# Patient Record
Sex: Male | Born: 1950 | Hispanic: Yes | Smoking: Never smoker
Health system: Southern US, Community
[De-identification: ages and names within clinical notes are randomized; demographics above are authoritative.]

## PROBLEM LIST (undated history)

## (undated) DIAGNOSIS — E785 Hyperlipidemia, unspecified: Secondary | ICD-10-CM

## (undated) HISTORY — PX: TONSILLECTOMY: SUR1361

## (undated) HISTORY — DX: Hyperlipidemia, unspecified: E78.5

---

## 1989-12-08 HISTORY — PX: INGUINAL HERNIA REPAIR: SUR1180

## 2016-10-07 DIAGNOSIS — R1084 Generalized abdominal pain: Secondary | ICD-10-CM | POA: Diagnosis not present

## 2016-10-07 DIAGNOSIS — Z6823 Body mass index (BMI) 23.0-23.9, adult: Secondary | ICD-10-CM | POA: Diagnosis not present

## 2016-10-14 DIAGNOSIS — Z1159 Encounter for screening for other viral diseases: Secondary | ICD-10-CM | POA: Diagnosis not present

## 2016-10-14 DIAGNOSIS — I10 Essential (primary) hypertension: Secondary | ICD-10-CM | POA: Diagnosis not present

## 2016-10-17 DIAGNOSIS — Z6823 Body mass index (BMI) 23.0-23.9, adult: Secondary | ICD-10-CM | POA: Diagnosis not present

## 2016-10-17 DIAGNOSIS — E785 Hyperlipidemia, unspecified: Secondary | ICD-10-CM | POA: Diagnosis not present

## 2016-10-17 DIAGNOSIS — Z0000 Encounter for general adult medical examination without abnormal findings: Secondary | ICD-10-CM | POA: Diagnosis not present

## 2016-10-17 DIAGNOSIS — R1084 Generalized abdominal pain: Secondary | ICD-10-CM | POA: Diagnosis not present

## 2016-10-27 ENCOUNTER — Encounter: Payer: Self-pay | Admitting: Gastroenterology

## 2016-10-27 DIAGNOSIS — J060 Acute laryngopharyngitis: Secondary | ICD-10-CM | POA: Diagnosis not present

## 2016-10-27 DIAGNOSIS — R05 Cough: Secondary | ICD-10-CM | POA: Diagnosis not present

## 2016-11-12 ENCOUNTER — Other Ambulatory Visit: Payer: Self-pay

## 2016-11-12 ENCOUNTER — Encounter: Payer: Self-pay | Admitting: Nurse Practitioner

## 2016-11-12 ENCOUNTER — Ambulatory Visit (INDEPENDENT_AMBULATORY_CARE_PROVIDER_SITE_OTHER): Payer: Medicare Other | Admitting: Nurse Practitioner

## 2016-11-12 DIAGNOSIS — R109 Unspecified abdominal pain: Secondary | ICD-10-CM | POA: Insufficient documentation

## 2016-11-12 DIAGNOSIS — K4090 Unilateral inguinal hernia, without obstruction or gangrene, not specified as recurrent: Secondary | ICD-10-CM

## 2016-11-12 DIAGNOSIS — R1030 Lower abdominal pain, unspecified: Secondary | ICD-10-CM

## 2016-11-12 DIAGNOSIS — Z1211 Encounter for screening for malignant neoplasm of colon: Secondary | ICD-10-CM

## 2016-11-12 MED ORDER — PEG 3350-KCL-NA BICARB-NACL 420 G PO SOLR: 4000.0000 mL | ORAL | 0 refills | Status: DC

## 2016-11-12 NOTE — Progress Notes (Signed)
Primary Care Physician:  Dwana MelenaZack Hall, MD Primary Gastroenterologist:  Dr. Darrick PennaFields  Chief Complaint  Patient presents with  . Abdominal Pain    mid abd x 1 year  . Constipation    diarrhea when takes Linzess    HPI:   Omar Bell is a 65 y.o. male who presents on referral from primary care for abdominal pain. Patient last saw primary care on 10/17/2016 for annual wellness visit and follow-up on labs. At that time it was noted he is having abdominal pain which is improved with Linzess however hesitant to take due to diarrhea side effects, continues abdominal pain at times but not as bad as previous. This is been going on for 1 year. Exam "looks great", query IBS and referred to GI for further evaluation.  Today he states his abdominal pain started about a year ago. Possible increased stress due to work. Linzess did help some. He isn't taking it regularly because "Im not big on medications and I dont want something to treat symptoms, I would like to know the cause and treat that." Discussed etiology of constipation and mechanism of Linzess. Tries to drink 4-5 glasses of water a day. Pain occasionally brought on by water. His pain is lower abdomen, intermittent, described as "like a knife in yoru stomach." When he has a significant symptoms, cant stand out straight and needs to lie flat. History of bilateral inguinal hernia. When he has a protruding hernia he has "rock hard area at the protrusion" with associated pain and constipation. Occasionally goes 1-2 months without noticeable pain, but will sometimes have several episodes in a week. Noticed for a time that symptoms were occurring on Sundays. Has had one episode of hematochezia in the last year, has had hemorrhoids previously. Denies melena, unintentional weight loss, fever, chills. Does have dark stools but formed (consistent with [place] 3-4). Denies chest pain, dyspnea, dizziness, lightheadedness, syncope, near syncope. Denies any other  upper or lower GI symptoms.  Has never had a colonoscopy before.  Past Medical History:  Diagnosis Date  . Hyperlipidemia     Past Surgical History:  Procedure Laterality Date  . INGUINAL HERNIA REPAIR      No current outpatient prescriptions on file.   No current facility-administered medications for this visit.     Allergies as of 11/12/2016  . (No Known Allergies)    Family History  Problem Relation Age of Onset  . Colon cancer Neg Hx     Social History   Social History  . Marital status: Single    Spouse name: N/A  . Number of children: N/A  . Years of education: N/A   Occupational History  . Not on file.   Social History Main Topics  . Smoking status: Never Smoker  . Smokeless tobacco: Never Used  . Alcohol use No  . Drug use: No  . Sexual activity: Not on file   Other Topics Concern  . Not on file   Social History Narrative  . No narrative on file    Review of Systems: Complete ROS negative except as per HPI.    Physical Exam: BP 123/81   Pulse 73   Temp 97.9 F (36.6 C) (Oral)   Ht 5\' 10"  (1.778 m)   Wt 158 lb 6.4 oz (71.8 kg)   BMI 22.73 kg/m  General:   Alert and oriented. Pleasant and cooperative. Well-nourished and well-developed.  Head:  Normocephalic and atraumatic. Eyes:  Without icterus, sclera clear and conjunctiva  pink.  Ears:  Normal auditory acuity. Cardiovascular:  S1, S2 present without murmurs appreciated. Extremities without clubbing or edema. Respiratory:  Clear to auscultation bilaterally. No wheezes, rales, or rhonchi. No distress.  Gastrointestinal:  +BS, soft, non-tender and non-distended. No HSM noted. No guarding or rebound. No masses appreciated.  Rectal:  Deferred  Musculoskalatal:  Symmetrical without gross deformities.  Neurologic:  Alert and oriented x4;  grossly normal neurologically. Psych:  Alert and cooperative. Normal mood and affect. Heme/Lymph/Immune: No excessive bruising noted.    11/12/2016  3:44 PM   Disclaimer: This note was dictated with voice recognition software. Similar sounding words can inadvertently be transcribed and may not be corrected upon review.

## 2016-11-12 NOTE — Patient Instructions (Addendum)
1. Have your x-ray done when you're able to. 2. We will refer you to a surgeon to evaluate your hernias. 3. We will schedule your colonoscopy for you. 4. Further recommendations to be made based on the results of your colonoscopy. 5. Return for follow-up in 3 months. 6. To help with possible constipation increase the fiber and water in her diet. Further information below. You can also look into adding a fiber supplement 1-2 times a day which are available over-the-counter and includes things such as Metamucil, Benefiber, fiber chews, fiber dummies, etc.    High-Fiber Diet Fiber, also called dietary fiber, is a type of carbohydrate found in fruits, vegetables, whole grains, and beans. A high-fiber diet can have many health benefits. Your health care provider may recommend a high-fiber diet to help:  Prevent constipation. Fiber can make your bowel movements more regular.  Lower your cholesterol.  Relieve hemorrhoids, uncomplicated diverticulosis, or irritable bowel syndrome.  Prevent overeating as part of a weight-loss plan.  Prevent heart disease, type 2 diabetes, and certain cancers. What is my plan? The recommended daily intake of fiber includes:  38 grams for men under age 66.  30 grams for men over age 66.  25 grams for women under age 66.  21 grams for women over age 66. You can get the recommended daily intake of dietary fiber by eating a variety of fruits, vegetables, grains, and beans. Your health care provider may also recommend a fiber supplement if it is not possible to get enough fiber through your diet. What do I need to know about a high-fiber diet?  Fiber supplements have not been widely studied for their effectiveness, so it is better to get fiber through food sources.  Always check the fiber content on thenutrition facts label of any prepackaged food. Look for foods that contain at least 5 grams of fiber per serving.  Ask your dietitian if you have questions  about specific foods that are related to your condition, especially if those foods are not listed in the following section.  Increase your daily fiber consumption gradually. Increasing your intake of dietary fiber too quickly may cause bloating, cramping, or gas.  Drink plenty of water. Water helps you to digest fiber. What foods can I eat? Grains  Whole-grain breads. Multigrain cereal. Oats and oatmeal. Brown rice. Barley. Bulgur wheat. Millet. Bran muffins. Popcorn. Rye wafer crackers. Vegetables  Sweet potatoes. Spinach. Kale. Artichokes. Cabbage. Broccoli. Green peas. Carrots. Squash. Fruits  Berries. Pears. Apples. Oranges. Avocados. Prunes and raisins. Dried figs. Meats and Other Protein Sources  Navy, kidney, pinto, and soy beans. Split peas. Lentils. Nuts and seeds. Dairy  Fiber-fortified yogurt. Beverages  Fiber-fortified soy milk. Fiber-fortified orange juice. Other  Fiber bars. The items listed above may not be a complete list of recommended foods or beverages. Contact your dietitian for more options.  What foods are not recommended? Grains  White bread. Pasta made with refined flour. White rice. Vegetables  Fried potatoes. Canned vegetables. Well-cooked vegetables. Fruits  Fruit juice. Cooked, strained fruit. Meats and Other Protein Sources  Fatty cuts of meat. Fried Environmental education officerpoultry or fried fish. Dairy  Milk. Yogurt. Cream cheese. Sour cream. Beverages  Soft drinks. Other  Cakes and pastries. Butter and oils. The items listed above may not be a complete list of foods and beverages to avoid. Contact your dietitian for more information.  What are some tips for including high-fiber foods in my diet?  Eat a wide variety of high-fiber foods.  Make sure that half of all grains consumed each day are whole grains.  Replace breads and cereals made from refined flour or white flour with whole-grain breads and cereals.  Replace white rice with brown rice, bulgur wheat, or  millet.  Start the day with a breakfast that is high in fiber, such as a cereal that contains at least 5 grams of fiber per serving.  Use beans in place of meat in soups, salads, or pasta.  Eat high-fiber snacks, such as berries, raw vegetables, nuts, or popcorn. This information is not intended to replace advice given to you by your health care provider. Make sure you discuss any questions you have with your health care provider. Document Released: 11/24/2005 Document Revised: 05/01/2016 Document Reviewed: 05/09/2014 Elsevier Interactive Patient Education  2017 [employer]     Constipation, Adult Constipation is when a person has fewer bowel movements in a week than normal, has difficulty having a bowel movement, or has stools that are dry, hard, or larger than normal. Constipation may be caused by an underlying condition. It may become worse with age if a person takes certain medicines and does not take in enough fluids. Follow these instructions at home: Eating and drinking  Eat foods that have a lot of fiber, such as fresh fruits and vegetables, whole grains, and beans.  Limit foods that are high in fat, low in fiber, or overly processed, such as french fries, hamburgers, cookies, candies, and soda.  Drink enough fluid to keep your urine clear or pale yellow. General instructions  Exercise regularly or as told by your health care provider.  Go to the restroom when you have the urge to go. Do not hold it in.  Take over-the-counter and prescription medicines only as told by your health care provider. These include any fiber supplements.  Practice pelvic floor retraining exercises, such as deep breathing while relaxing the lower abdomen and pelvic floor relaxation during bowel movements.  Watch your condition for any changes.  Keep all follow-up visits as told by your health care provider. This is important. Contact a health care provider if:  You have pain that gets  worse.  You have a fever.  You do not have a bowel movement after 4 days.  You vomit.  You are not hungry.  You lose weight.  You are bleeding from the anus.  You have thin, pencil-like stools. Get help right away if:  You have a fever and your symptoms suddenly get worse.  You leak stool or have blood in your stool.  Your abdomen is bloated.  You have severe pain in your abdomen.  You feel dizzy or you faint. This information is not intended to replace advice given to you by your health care provider. Make sure you discuss any questions you have with your health care provider. Document Released: 08/22/2004 Document Revised: 06/13/2016 Document Reviewed: 05/14/2016 Elsevier Interactive Patient Education  2017 [employer]

## 2016-11-12 NOTE — Assessment & Plan Note (Signed)
The patient notes a one-year history of abdominal pain. This improved on Linzess but he is not taking it regularly. At this point I will check an abdominal x-ray for stool burden as I feel his abdominal pain is likely due to constipation. However, he has bilateral inguinal hernias and may occasionally "pop out" and when they do typically are "rock hard" and occasionally symptomatic with pain in onset of constipation when this happens. He has had one of his hernias operated on previously when he lived in another area. This point I recommend refer to surgeon to evaluate bilateral inguinal hernias for options including but not limited to surgical repair. I will recommend he increase the fiber and water in his diet. He can take a fiber supplement as well.   We will also proceed with colonoscopy because he is never had a colonoscopy before. Return for follow-up in 3 months.  Proceed with colonoscopy WITHOUT SEDATION with Dr. Darrick PennaFields in the near future. The risks, benefits, and alternatives have been discussed in detail with the patient. They state understanding and desire to proceed.   The patient is not currently on any medications. He is specifically requesting the procedure to be done without sedation and I have discussed that there is always the option to change his mind the day of the procedure or during the procedure if he would like sedation medicine. He verbalizes understanding.

## 2016-11-13 ENCOUNTER — Ambulatory Visit (HOSPITAL_COMMUNITY)
Admission: RE | Admit: 2016-11-13 | Discharge: 2016-11-13 | Disposition: A | Discharge status: Home / Self Care | Payer: Medicare Other | Source: Ambulatory Visit | Attending: Nurse Practitioner | Admitting: Nurse Practitioner

## 2016-11-13 DIAGNOSIS — R1030 Lower abdominal pain, unspecified: Secondary | ICD-10-CM | POA: Diagnosis not present

## 2016-11-13 DIAGNOSIS — K5900 Constipation, unspecified: Secondary | ICD-10-CM | POA: Diagnosis not present

## 2016-11-13 NOTE — Addendum Note (Signed)
Addended by: Delane GingerGILL, ERIC A on: 11/13/2016 04:05 PM   Modules accepted: Orders

## 2016-11-14 ENCOUNTER — Telehealth: Payer: Self-pay

## 2016-11-14 NOTE — Telephone Encounter (Signed)
Called and informed pt of appt with Dr. Earlene Plateravis 11/26/16 at 2:00pm.

## 2016-11-17 ENCOUNTER — Telehealth: Payer: Self-pay | Admitting: Internal Medicine

## 2016-11-17 NOTE — Progress Notes (Signed)
Tried to call and Vm not set up.

## 2016-11-17 NOTE — Progress Notes (Signed)
CC'D TO PCP °

## 2016-11-18 NOTE — Progress Notes (Signed)
Pt is aware of results and plan.

## 2016-11-20 ENCOUNTER — Telehealth: Payer: Self-pay

## 2016-11-20 NOTE — Telephone Encounter (Signed)
Called pt. He can be at hospital at 11:45am 11/25/16 for TCS at 12:45pm. New times for prep given. Called and informed scheduler.

## 2016-11-20 NOTE — Telephone Encounter (Signed)
Opened in error

## 2016-11-20 NOTE — Telephone Encounter (Signed)
Attempted to call pt at 10:45am to inform of TCS scheduled for 11/25/16 has been changed to 12:45pm. No answer, voicemail has not been setup.

## 2016-11-21 ENCOUNTER — Telehealth: Payer: Self-pay | Admitting: Nurse Practitioner

## 2016-11-21 NOTE — Telephone Encounter (Signed)
Attempted to call pt x 2, no answer and voicemail hasn't been set-up. Time for TCS 11/25/16 has changed d/t a case had to be added in OR before his case. Need to inform pt of new arrival time and new times for instructions on day of procedure.

## 2016-11-21 NOTE — Telephone Encounter (Signed)
Patient called that he would like to cancel his TCS for Tuesday. FYI

## 2016-11-24 NOTE — Telephone Encounter (Signed)
Colonoscopy for 11/25/16 cancelled per pt request. LMOVM and informed Endo scheduler.

## 2016-11-25 ENCOUNTER — Ambulatory Visit (HOSPITAL_COMMUNITY): Admission: RE | Admit: 2016-11-25 | Payer: Medicare Other | Source: Ambulatory Visit | Admitting: Gastroenterology

## 2016-11-25 ENCOUNTER — Encounter (HOSPITAL_COMMUNITY): Admission: RE | Payer: Self-pay | Source: Ambulatory Visit

## 2016-11-25 SURGERY — COLONOSCOPY
Anesthesia: Moderate Sedation

## 2016-11-26 DIAGNOSIS — K4021 Bilateral inguinal hernia, without obstruction or gangrene, recurrent: Secondary | ICD-10-CM | POA: Diagnosis not present

## 2016-11-29 NOTE — Progress Notes (Signed)
REVIEWED. PT CANCELED TCS.

## 2017-02-10 ENCOUNTER — Encounter: Payer: Self-pay | Admitting: Gastroenterology

## 2017-02-10 ENCOUNTER — Encounter: Payer: Self-pay | Admitting: Nurse Practitioner

## 2017-02-10 ENCOUNTER — Ambulatory Visit: Payer: Medicare Other | Admitting: Nurse Practitioner

## 2017-02-10 ENCOUNTER — Telehealth: Payer: Self-pay | Admitting: Nurse Practitioner

## 2017-02-10 NOTE — Progress Notes (Deleted)
    Referring Provider: [name], [name], MD Primary Care Physician:  Dwana MelenaZack Hall, MD Primary GI:  Dr. Darrick PennaFields  No chief complaint on file.   HPI:   Omar Bell is a 66 y.o. male who presents for follow-up on lower abdominal pain. The patient was last seen in our office 11/12/2016 for the same. At that time he noted his abdominal pain started about a year prior with increased work stress. Linzess did help some. He isn't taking it regularly because "Im not big on medications and I dont want something to treat symptoms, I would like to know the cause and treat that." History of inguinal hernia bilateral with occasional pain, occasionally goes 1-2 months without symptoms. Noted 1 episode of hematochezia and previous year in the setting of hemorrhoids. Had never had a colonoscopy before. Abdominal x-ray was ordered, referred to surgeon to evaluate inguinal hernias, recommended colonoscopy, return for follow-up in 3 months. Recommended increased fiber and water and written education materials provided related to high fiber diet.  Abdominal x-ray found normal bowel gas pattern, mild amount of stool in the right colon, no evidence of bowel obstruction or ileus. He was notified of his referral to surgery date and time. He: 11/21/2016 to schedule his colonoscopy which had been planned for 11/25/2016.  Today he states   Past Medical History:  Diagnosis Date  . Hyperlipidemia     Past Surgical History:  Procedure Laterality Date  . INGUINAL HERNIA REPAIR      No current outpatient prescriptions on file.   No current facility-administered medications for this visit.     Allergies as of 02/10/2017  . (No Known Allergies)    Family History  Problem Relation Age of Onset  . Colon cancer Neg Hx     Social History   Social History  . Marital status: Divorced    Spouse name: N/A  . Number of children: N/A  . Years of education: N/A   Social History Main Topics  . Smoking status: Never  Smoker  . Smokeless tobacco: Never Used  . Alcohol use No  . Drug use: No  . Sexual activity: Not on file   Other Topics Concern  . Not on file   Social History Narrative  . No narrative on file    Review of Systems: Complete ROS negative except as per HPI.   Physical Exam: There were no vitals taken for this visit. General:   Alert and oriented. Pleasant and cooperative. Well-nourished and well-developed.  Head:  Normocephalic and atraumatic. Eyes:  Without icterus, sclera clear and conjunctiva pink.  Ears:  Normal auditory acuity. Mouth:  No deformity or lesions, oral mucosa pink.  Throat/Neck:  Supple, without mass or thyromegaly. Cardiovascular:  S1, S2 present without murmurs appreciated. Normal pulses noted. Extremities without clubbing or edema. Respiratory:  Clear to auscultation bilaterally. No wheezes, rales, or rhonchi. No distress.  Gastrointestinal:  +BS, soft, non-tender and non-distended. No HSM noted. No guarding or rebound. No masses appreciated.  Rectal:  Deferred  Musculoskalatal:  Symmetrical without gross deformities. Normal posture. Skin:  Intact without significant lesions or rashes. Neurologic:  Alert and oriented x4;  grossly normal neurologically. Psych:  Alert and cooperative. Normal mood and affect. Heme/Lymph/Immune: No significant cervical adenopathy. No excessive bruising noted.    02/10/2017 9:48 AM   Disclaimer: This note was dictated with voice recognition software. Similar sounding words can inadvertently be transcribed and may not be corrected upon review.

## 2017-02-10 NOTE — Telephone Encounter (Signed)
Patient was a no show and letter sent  °

## 2017-02-10 NOTE — Telephone Encounter (Signed)
Noted  

## 2017-02-24 ENCOUNTER — Telehealth: Payer: Self-pay | Admitting: Gastroenterology

## 2017-02-24 NOTE — Telephone Encounter (Signed)
Pt sent us a letter explaining why he missed his appointment on 02/10/2017.  His phone wasn't on and he had been sick in the bed.

## 2017-05-13 DIAGNOSIS — E785 Hyperlipidemia, unspecified: Secondary | ICD-10-CM | POA: Diagnosis not present

## 2017-05-15 DIAGNOSIS — K5900 Constipation, unspecified: Secondary | ICD-10-CM | POA: Diagnosis not present

## 2017-05-15 DIAGNOSIS — E785 Hyperlipidemia, unspecified: Secondary | ICD-10-CM | POA: Diagnosis not present

## 2019-03-18 ENCOUNTER — Other Ambulatory Visit: Payer: Self-pay

## 2019-03-18 ENCOUNTER — Emergency Department (HOSPITAL_COMMUNITY): Payer: 59 | Admitting: Anesthesiology

## 2019-03-18 ENCOUNTER — Encounter (HOSPITAL_COMMUNITY): Admission: EM | Disposition: A | Discharge status: Home Health | Payer: Self-pay | Source: Home / Self Care

## 2019-03-18 ENCOUNTER — Encounter (HOSPITAL_COMMUNITY): Payer: Self-pay

## 2019-03-18 ENCOUNTER — Inpatient Hospital Stay (HOSPITAL_COMMUNITY)
Admission: EM | Admit: 2019-03-18 | Discharge: 2019-04-04 | DRG: 329 | Disposition: A | Discharge status: Home Health | Payer: 59 | Attending: Surgery | Admitting: Surgery

## 2019-03-18 ENCOUNTER — Emergency Department (HOSPITAL_COMMUNITY): Payer: 59

## 2019-03-18 DIAGNOSIS — K4090 Unilateral inguinal hernia, without obstruction or gangrene, not specified as recurrent: Secondary | ICD-10-CM

## 2019-03-18 DIAGNOSIS — Z9119 Patient's noncompliance with other medical treatment and regimen: Secondary | ICD-10-CM | POA: Diagnosis not present

## 2019-03-18 DIAGNOSIS — Z809 Family history of malignant neoplasm, unspecified: Secondary | ICD-10-CM

## 2019-03-18 DIAGNOSIS — Z6821 Body mass index (BMI) 21.0-21.9, adult: Secondary | ICD-10-CM

## 2019-03-18 DIAGNOSIS — K4190 Unilateral femoral hernia, without obstruction or gangrene, not specified as recurrent: Secondary | ICD-10-CM | POA: Diagnosis present

## 2019-03-18 DIAGNOSIS — K9189 Other postprocedural complications and disorders of digestive system: Secondary | ICD-10-CM

## 2019-03-18 DIAGNOSIS — K631 Perforation of intestine (nontraumatic): Secondary | ICD-10-CM | POA: Diagnosis present

## 2019-03-18 DIAGNOSIS — E860 Dehydration: Secondary | ICD-10-CM

## 2019-03-18 DIAGNOSIS — I5031 Acute diastolic (congestive) heart failure: Secondary | ICD-10-CM | POA: Diagnosis not present

## 2019-03-18 DIAGNOSIS — E440 Moderate protein-calorie malnutrition: Secondary | ICD-10-CM | POA: Diagnosis present

## 2019-03-18 DIAGNOSIS — J45909 Unspecified asthma, uncomplicated: Secondary | ICD-10-CM | POA: Diagnosis present

## 2019-03-18 DIAGNOSIS — K56609 Unspecified intestinal obstruction, unspecified as to partial versus complete obstruction: Secondary | ICD-10-CM

## 2019-03-18 DIAGNOSIS — K4041 Unilateral inguinal hernia, with gangrene, recurrent: Secondary | ICD-10-CM

## 2019-03-18 DIAGNOSIS — Z91018 Allergy to other foods: Secondary | ICD-10-CM

## 2019-03-18 DIAGNOSIS — E876 Hypokalemia: Secondary | ICD-10-CM | POA: Diagnosis present

## 2019-03-18 DIAGNOSIS — R000 Tachycardia, unspecified: Secondary | ICD-10-CM | POA: Diagnosis not present

## 2019-03-18 DIAGNOSIS — K55029 Acute infarction of small intestine, extent unspecified: Secondary | ICD-10-CM | POA: Diagnosis present

## 2019-03-18 DIAGNOSIS — N179 Acute kidney failure, unspecified: Secondary | ICD-10-CM | POA: Diagnosis not present

## 2019-03-18 DIAGNOSIS — I4510 Unspecified right bundle-branch block: Secondary | ICD-10-CM | POA: Diagnosis present

## 2019-03-18 DIAGNOSIS — R112 Nausea with vomiting, unspecified: Secondary | ICD-10-CM

## 2019-03-18 DIAGNOSIS — K567 Ileus, unspecified: Secondary | ICD-10-CM

## 2019-03-18 DIAGNOSIS — R1030 Lower abdominal pain, unspecified: Secondary | ICD-10-CM | POA: Diagnosis not present

## 2019-03-18 DIAGNOSIS — E785 Hyperlipidemia, unspecified: Secondary | ICD-10-CM | POA: Diagnosis present

## 2019-03-18 DIAGNOSIS — R0600 Dyspnea, unspecified: Secondary | ICD-10-CM

## 2019-03-18 DIAGNOSIS — D62 Acute posthemorrhagic anemia: Secondary | ICD-10-CM | POA: Diagnosis not present

## 2019-03-18 DIAGNOSIS — R0689 Other abnormalities of breathing: Secondary | ICD-10-CM | POA: Diagnosis not present

## 2019-03-18 DIAGNOSIS — R109 Unspecified abdominal pain: Secondary | ICD-10-CM | POA: Diagnosis present

## 2019-03-18 DIAGNOSIS — Z91199 Patient's noncompliance with other medical treatment and regimen due to unspecified reason: Secondary | ICD-10-CM

## 2019-03-18 DIAGNOSIS — Z0189 Encounter for other specified special examinations: Secondary | ICD-10-CM

## 2019-03-18 DIAGNOSIS — G9341 Metabolic encephalopathy: Secondary | ICD-10-CM | POA: Diagnosis not present

## 2019-03-18 DIAGNOSIS — K4030 Unilateral inguinal hernia, with obstruction, without gangrene, not specified as recurrent: Secondary | ICD-10-CM

## 2019-03-18 HISTORY — PX: LAPAROSCOPIC INGUINAL HERNIA REPAIR: SUR788

## 2019-03-18 HISTORY — PX: SMALL INTESTINE SURGERY: SHX150

## 2019-03-18 HISTORY — PX: INGUINAL HERNIA REPAIR: SHX194

## 2019-03-18 LAB — LIPASE, BLOOD: Lipase: 24 U/L (ref 11–51)

## 2019-03-18 LAB — CBC
HCT: 50.3 % (ref 39.0–52.0)
Hemoglobin: 17.3 g/dL — ABNORMAL HIGH (ref 13.0–17.0)
MCH: 31.2 pg (ref 26.0–34.0)
MCHC: 34.4 g/dL (ref 30.0–36.0)
MCV: 90.6 fL (ref 80.0–100.0)
Platelets: 235 10*3/uL (ref 150–400)
RBC: 5.55 MIL/uL (ref 4.22–5.81)
RDW: 12.6 % (ref 11.5–15.5)
WBC: 19.5 10*3/uL — ABNORMAL HIGH (ref 4.0–10.5)
nRBC: 0 % (ref 0.0–0.2)

## 2019-03-18 LAB — PREALBUMIN: Prealbumin: 19.2 mg/dL (ref 18–38)

## 2019-03-18 SURGERY — REPAIR, HERNIA, INGUINAL, BILATERAL, LAPAROSCOPIC
Anesthesia: General | Laterality: Bilateral

## 2019-03-18 MED ORDER — ONDANSETRON 8 MG PO TBDP
8.0000 mg | ORAL_TABLET | Freq: Once | ORAL | Status: AC
  Administered 2019-03-18: 17:00:00 8 mg via ORAL
  Filled 2019-03-18: qty 1

## 2019-03-18 MED ORDER — STERILE WATER FOR IRRIGATION IR SOLN
Status: DC | PRN
  Administered 2019-03-18: 1000 mL

## 2019-03-18 MED ORDER — FENTANYL CITRATE (PF) 100 MCG/2ML IJ SOLN
INTRAMUSCULAR | Status: AC
  Filled 2019-03-18: qty 2

## 2019-03-18 MED ORDER — MIDAZOLAM HCL 5 MG/5ML IJ SOLN
INTRAMUSCULAR | Status: DC | PRN
  Administered 2019-03-18: 2 mg via INTRAVENOUS

## 2019-03-18 MED ORDER — MIDAZOLAM HCL 2 MG/2ML IJ SOLN
INTRAMUSCULAR | Status: AC
  Filled 2019-03-18: qty 2

## 2019-03-18 MED ORDER — PHENYLEPHRINE 40 MCG/ML (10ML) SYRINGE FOR IV PUSH (FOR BLOOD PRESSURE SUPPORT)
PREFILLED_SYRINGE | INTRAVENOUS | Status: AC
  Filled 2019-03-18: qty 20

## 2019-03-18 MED ORDER — LACTATED RINGERS IV SOLN
INTRAVENOUS | Status: DC | PRN
  Administered 2019-03-18 (×4): via INTRAVENOUS

## 2019-03-18 MED ORDER — 0.9 % SODIUM CHLORIDE (POUR BTL) OPTIME
TOPICAL | Status: DC | PRN
  Administered 2019-03-18: 20:00:00 1000 mL

## 2019-03-18 MED ORDER — POTASSIUM CHLORIDE 10 MEQ/100ML IV SOLN
10.0000 meq | INTRAVENOUS | Status: AC
  Administered 2019-03-18 (×3): 10 meq via INTRAVENOUS
  Filled 2019-03-18 (×3): qty 100

## 2019-03-18 MED ORDER — SUCCINYLCHOLINE CHLORIDE 200 MG/10ML IV SOSY
PREFILLED_SYRINGE | INTRAVENOUS | Status: DC | PRN
  Administered 2019-03-18: 100 mg via INTRAVENOUS

## 2019-03-18 MED ORDER — SODIUM CHLORIDE 0.9 % IV SOLN
INTRAVENOUS | Status: DC
  Filled 2019-03-18: qty 6

## 2019-03-18 MED ORDER — SODIUM CHLORIDE 0.9 % IV BOLUS: 1000.0000 mL | Freq: Once | INTRAVENOUS | Status: DC

## 2019-03-18 MED ORDER — ALBUMIN HUMAN 5 % IV SOLN
INTRAVENOUS | Status: AC
  Filled 2019-03-18: qty 250

## 2019-03-18 MED ORDER — LIDOCAINE 2% (20 MG/ML) 5 ML SYRINGE
INTRAMUSCULAR | Status: AC
  Filled 2019-03-18: qty 5

## 2019-03-18 MED ORDER — FENTANYL CITRATE (PF) 250 MCG/5ML IJ SOLN
INTRAMUSCULAR | Status: AC
  Filled 2019-03-18: qty 5

## 2019-03-18 MED ORDER — POTASSIUM CHLORIDE CRYS ER 20 MEQ PO TBCR: 80.0000 meq | EXTENDED_RELEASE_TABLET | Freq: Once | ORAL | Status: DC

## 2019-03-18 MED ORDER — PROPOFOL 10 MG/ML IV BOLUS
INTRAVENOUS | Status: AC
  Filled 2019-03-18: qty 20

## 2019-03-18 MED ORDER — BUPIVACAINE-EPINEPHRINE (PF) 0.25% -1:200000 IJ SOLN
INTRAMUSCULAR | Status: AC
  Filled 2019-03-18: qty 30

## 2019-03-18 MED ORDER — LACTATED RINGERS IR SOLN
Status: DC | PRN
  Administered 2019-03-18: 3000 mL
  Administered 2019-03-18 (×2): 1000 mL

## 2019-03-18 MED ORDER — PROPOFOL 10 MG/ML IV BOLUS
INTRAVENOUS | Status: DC | PRN
  Administered 2019-03-18: 120 mg via INTRAVENOUS

## 2019-03-18 MED ORDER — CHLORHEXIDINE GLUCONATE CLOTH 2 % EX PADS: 6.0000 | MEDICATED_PAD | Freq: Once | CUTANEOUS | Status: DC

## 2019-03-18 MED ORDER — METRONIDAZOLE IN NACL 5-0.79 MG/ML-% IV SOLN
INTRAVENOUS | Status: AC
  Filled 2019-03-18: qty 100

## 2019-03-18 MED ORDER — MORPHINE SULFATE (PF) 4 MG/ML IV SOLN
4.0000 mg | Freq: Once | INTRAVENOUS | Status: AC
  Administered 2019-03-18: 17:00:00 4 mg via INTRAVENOUS
  Filled 2019-03-18: qty 1

## 2019-03-18 MED ORDER — SUCCINYLCHOLINE CHLORIDE 200 MG/10ML IV SOSY
PREFILLED_SYRINGE | INTRAVENOUS | Status: AC
  Filled 2019-03-18: qty 10

## 2019-03-18 MED ORDER — ROCURONIUM BROMIDE 10 MG/ML (PF) SYRINGE
PREFILLED_SYRINGE | INTRAVENOUS | Status: DC | PRN
  Administered 2019-03-18: 50 mg via INTRAVENOUS
  Administered 2019-03-18 (×2): 10 mg via INTRAVENOUS

## 2019-03-18 MED ORDER — SODIUM CHLORIDE 0.9 % IV SOLN
INTRAVENOUS | Status: DC | PRN
  Administered 2019-03-18: 21:00:00

## 2019-03-18 MED ORDER — BUPIVACAINE LIPOSOME 1.3 % IJ SUSP
INTRAMUSCULAR | Status: DC | PRN
  Administered 2019-03-18: 20 mL

## 2019-03-18 MED ORDER — ALBUMIN HUMAN 5 % IV SOLN
INTRAVENOUS | Status: DC | PRN
  Administered 2019-03-18 (×2): via INTRAVENOUS

## 2019-03-18 MED ORDER — LIDOCAINE 2% (20 MG/ML) 5 ML SYRINGE
INTRAMUSCULAR | Status: DC | PRN
  Administered 2019-03-18: 60 mg via INTRAVENOUS

## 2019-03-18 MED ORDER — GABAPENTIN 300 MG PO CAPS
ORAL_CAPSULE | ORAL | Status: AC
  Filled 2019-03-18: qty 1

## 2019-03-18 MED ORDER — GABAPENTIN 300 MG PO CAPS
300.0000 mg | ORAL_CAPSULE | ORAL | Status: AC
  Administered 2019-03-18: 300 mg via ORAL

## 2019-03-18 MED ORDER — HYDROMORPHONE HCL 1 MG/ML IJ SOLN: 0.2500 mg | INTRAMUSCULAR | Status: DC | PRN

## 2019-03-18 MED ORDER — FENTANYL CITRATE (PF) 100 MCG/2ML IJ SOLN
INTRAMUSCULAR | Status: DC | PRN
  Administered 2019-03-18 (×5): 50 ug via INTRAVENOUS
  Administered 2019-03-18 (×2): 100 ug via INTRAVENOUS

## 2019-03-18 MED ORDER — LACTATED RINGERS IV SOLN
INTRAVENOUS | Status: DC
  Administered 2019-03-18 (×3): via INTRAVENOUS

## 2019-03-18 MED ORDER — ROCURONIUM BROMIDE 10 MG/ML (PF) SYRINGE
PREFILLED_SYRINGE | INTRAVENOUS | Status: AC
  Filled 2019-03-18: qty 10

## 2019-03-18 MED ORDER — SODIUM CHLORIDE 0.9% FLUSH
3.0000 mL | Freq: Once | INTRAVENOUS | Status: AC
  Administered 2019-03-18: 16:00:00 3 mL via INTRAVENOUS

## 2019-03-18 MED ORDER — PHENYLEPHRINE 40 MCG/ML (10ML) SYRINGE FOR IV PUSH (FOR BLOOD PRESSURE SUPPORT)
PREFILLED_SYRINGE | INTRAVENOUS | Status: DC | PRN
  Administered 2019-03-18 (×3): 80 ug via INTRAVENOUS
  Administered 2019-03-18: 120 ug via INTRAVENOUS
  Administered 2019-03-18 (×4): 80 ug via INTRAVENOUS

## 2019-03-18 MED ORDER — BUPIVACAINE-EPINEPHRINE 0.25% -1:200000 IJ SOLN
INTRAMUSCULAR | Status: DC | PRN
  Administered 2019-03-18: 60 mL

## 2019-03-18 MED ORDER — CEFAZOLIN SODIUM-DEXTROSE 2-4 GM/100ML-% IV SOLN
INTRAVENOUS | Status: AC
  Filled 2019-03-18: qty 100

## 2019-03-18 MED ORDER — POTASSIUM CHLORIDE 10 MEQ/100ML IV SOLN
10.0000 meq | Freq: Once | INTRAVENOUS | Status: AC
  Administered 2019-03-18: 17:00:00 10 meq via INTRAVENOUS
  Filled 2019-03-18: qty 100

## 2019-03-18 MED ORDER — BUPIVACAINE LIPOSOME 1.3 % IJ SUSP
20.0000 mL | Freq: Once | INTRAMUSCULAR | Status: DC
  Filled 2019-03-18: qty 20

## 2019-03-18 MED ORDER — ACETAMINOPHEN 500 MG PO TABS
ORAL_TABLET | ORAL | Status: AC
  Administered 2019-03-18: 1000 mg
  Filled 2019-03-18: qty 2

## 2019-03-18 MED ORDER — ACETAMINOPHEN 500 MG PO TABS: 1000.0000 mg | ORAL_TABLET | ORAL | Status: AC

## 2019-03-18 MED ORDER — METRONIDAZOLE IN NACL 5-0.79 MG/ML-% IV SOLN
500.0000 mg | INTRAVENOUS | Status: AC
  Administered 2019-03-18: 500 mg via INTRAVENOUS

## 2019-03-18 MED ORDER — CEFAZOLIN SODIUM-DEXTROSE 2-4 GM/100ML-% IV SOLN
2.0000 g | INTRAVENOUS | Status: AC
  Administered 2019-03-18: 2 g via INTRAVENOUS

## 2019-03-18 MED ORDER — SUGAMMADEX SODIUM 500 MG/5ML IV SOLN
INTRAVENOUS | Status: DC | PRN
  Administered 2019-03-18: 200 mg via INTRAVENOUS

## 2019-03-18 SURGICAL SUPPLY — 79 items
BLADE EXTENDED COATED 6.5IN (ELECTRODE) IMPLANT
BLADE HEX COATED 2.75 (ELECTRODE) IMPLANT
CABLE HIGH FREQUENCY MONO STRZ (ELECTRODE) ×3 IMPLANT
CELLS DAT CNTRL 66122 CELL SVR (MISCELLANEOUS) ×1 IMPLANT
CHLORAPREP W/TINT 26 (MISCELLANEOUS) ×3 IMPLANT
COUNTER NEEDLE 20 DBL MAG RED (NEEDLE) ×3 IMPLANT
COVER MAYO STAND STRL (DRAPES) ×3 IMPLANT
COVER SURGICAL LIGHT HANDLE (MISCELLANEOUS) ×3 IMPLANT
COVER WAND RF STERILE (DRAPES) IMPLANT
DECANTER SPIKE VIAL GLASS SM (MISCELLANEOUS) ×3 IMPLANT
DEVICE SECURE STRAP 25 ABSORB (INSTRUMENTS) ×2 IMPLANT
DRAIN CHANNEL 19F RND (DRAIN) ×2 IMPLANT
DRAPE LAPAROSCOPIC ABDOMINAL (DRAPES) ×3 IMPLANT
DRAPE SHEET LG 3/4 BI-LAMINATE (DRAPES) ×5 IMPLANT
DRAPE UTILITY XL STRL (DRAPES) ×6 IMPLANT
DRAPE WARM FLUID 44X44 (DRAPE) ×3 IMPLANT
DRSG OPSITE POSTOP 4X10 (GAUZE/BANDAGES/DRESSINGS) IMPLANT
DRSG OPSITE POSTOP 4X6 (GAUZE/BANDAGES/DRESSINGS) ×2 IMPLANT
DRSG OPSITE POSTOP 4X8 (GAUZE/BANDAGES/DRESSINGS) IMPLANT
DRSG TEGADERM 2-3/8X2-3/4 SM (GAUZE/BANDAGES/DRESSINGS) ×8 IMPLANT
DRSG TEGADERM 4X4.75 (GAUZE/BANDAGES/DRESSINGS) ×3 IMPLANT
ELECT PENCIL ROCKER SW 15FT (MISCELLANEOUS) ×3 IMPLANT
ELECT REM PT RETURN 15FT ADLT (MISCELLANEOUS) ×3 IMPLANT
EVACUATOR SILICONE 100CC (DRAIN) ×2 IMPLANT
GAUZE SPONGE 2X2 8PLY STRL LF (GAUZE/BANDAGES/DRESSINGS) ×1 IMPLANT
GAUZE SPONGE 4X4 12PLY STRL (GAUZE/BANDAGES/DRESSINGS) ×3 IMPLANT
GLOVE ECLIPSE 8.0 STRL XLNG CF (GLOVE) ×6 IMPLANT
GLOVE INDICATOR 8.0 STRL GRN (GLOVE) ×6 IMPLANT
GOWN STRL REUS W/TWL XL LVL3 (GOWN DISPOSABLE) ×6 IMPLANT
HANDLE SUCTION POOLE (INSTRUMENTS) ×1 IMPLANT
IRRIG SUCT STRYKERFLOW 2 WTIP (MISCELLANEOUS) ×3
IRRIGATION SUCT STRKRFLW 2 WTP (MISCELLANEOUS) IMPLANT
KIT BASIN OR (CUSTOM PROCEDURE TRAY) ×3 IMPLANT
KIT TURNOVER KIT A (KITS) ×2 IMPLANT
LEGGING LITHOTOMY PAIR STRL (DRAPES) IMPLANT
LIGASURE IMPACT 36 18CM CVD LR (INSTRUMENTS) ×2 IMPLANT
MESH PHASIX RESORB RECT 10X15 (Mesh General) ×4 IMPLANT
MESH PHASIX ST 15X20 (Mesh General) ×2 IMPLANT
NDL INSUFFLATION 14GA 120MM (NEEDLE) IMPLANT
NEEDLE INSUFFLATION 14GA 120MM (NEEDLE) ×3 IMPLANT
PACK GENERAL/GYN (CUSTOM PROCEDURE TRAY) ×3 IMPLANT
PAD POSITIONING PINK XL (MISCELLANEOUS) ×3 IMPLANT
PORT LAP GEL ALEXIS MED 5-9CM (MISCELLANEOUS) ×2 IMPLANT
RETRACTOR WND ALEXIS 18 MED (MISCELLANEOUS) IMPLANT
RTRCTR WOUND ALEXIS 18CM MED (MISCELLANEOUS) ×3
SCISSORS LAP 5X35 DISP (ENDOMECHANICALS) ×3 IMPLANT
SCISSORS LAP 5X45 EPIX DISP (ENDOMECHANICALS) ×2 IMPLANT
SET TUBE SMOKE EVAC HIGH FLOW (TUBING) ×3 IMPLANT
SLEEVE ADV FIXATION 5X100MM (TROCAR) ×5 IMPLANT
SLEEVE XCEL OPT CAN 5 100 (ENDOMECHANICALS) ×2 IMPLANT
SPONGE GAUZE 2X2 STER 10/PKG (GAUZE/BANDAGES/DRESSINGS) ×4
STAPLER 90 3.5 STAND SLIM (STAPLE) ×3
STAPLER 90 3.5 STD SLIM (STAPLE) IMPLANT
STAPLER PROXIMATE 75MM BLUE (STAPLE) ×2 IMPLANT
STAPLER VISISTAT 35W (STAPLE) ×3 IMPLANT
SUCTION POOLE HANDLE (INSTRUMENTS) ×3
SUT MNCRL AB 4-0 PS2 18 (SUTURE) ×3 IMPLANT
SUT PDS AB 1 CT1 27 (SUTURE) ×6 IMPLANT
SUT PDS AB 1 CTX 36 (SUTURE) IMPLANT
SUT PDS AB 1 TP1 96 (SUTURE) IMPLANT
SUT PROLENE 2 0 SH DA (SUTURE) ×2 IMPLANT
SUT SILK 2 0 (SUTURE) ×2
SUT SILK 2 0 SH CR/8 (SUTURE) ×7 IMPLANT
SUT SILK 2-0 18XBRD TIE 12 (SUTURE) ×1 IMPLANT
SUT SILK 3 0 (SUTURE) ×2
SUT SILK 3 0 SH CR/8 (SUTURE) ×3 IMPLANT
SUT SILK 3-0 18XBRD TIE 12 (SUTURE) ×1 IMPLANT
SUT VIC AB 2-0 SH 27 (SUTURE)
SUT VIC AB 2-0 SH 27X BRD (SUTURE) IMPLANT
SUT VICRYL 0 UR6 27IN ABS (SUTURE) ×3 IMPLANT
TACKER 5MM HERNIA 3.5CML NAB (ENDOMECHANICALS) IMPLANT
TAPE UMBILICAL COTTON 1/8X30 (MISCELLANEOUS) ×3 IMPLANT
TOWEL OR 17X26 10 PK STRL BLUE (TOWEL DISPOSABLE) ×6 IMPLANT
TOWEL OR NON WOVEN STRL DISP B (DISPOSABLE) ×6 IMPLANT
TRAY FOLEY MTR SLVR 16FR STAT (SET/KITS/TRAYS/PACK) ×3 IMPLANT
TRAY LAPAROSCOPIC (CUSTOM PROCEDURE TRAY) ×3 IMPLANT
TROCAR ADV FIXATION 5X100MM (TROCAR) ×3 IMPLANT
TROCAR XCEL BLUNT TIP 100MML (ENDOMECHANICALS) ×1 IMPLANT
YANKAUER SUCT BULB TIP 10FT TU (MISCELLANEOUS) ×5 IMPLANT

## 2019-03-18 NOTE — H&P (Signed)
Omar Bell  January 18, 1951 [id number]  CARE TEAM:  PCP: Benita Stabile, MD  Outpatient Care Team: Patient Care Team: Benita Stabile, MD as PCP - General (Internal Medicine) [place], MD as Consulting Physician (Gastroenterology)  Inpatient Treatment Team: Treatment Team: Attending Provider: Benjiman Core, MD; Physician Assistant: Tanda Rockers, PA-C; Registered Nurse: Valerie Salts, RN; Technician: Servando Snare, NT; Registered Nurse: Mechele Claude, RN; Consulting Physician: Montez Morita, Md, MD   This patient is a 68 y.o.male who presents today for surgical evaluation at the request of Dr Pickering/ Vilma Meckel, PA - [facility] ED.   Chief complaint / Reason for evaluation: Recurrent left inguinal hernia with vomiting and pain  68 year old gentleman that tends not to see doctors.  Recalls having a left inguinal hernia repaired in 1991 when he lived in [place].  Had recurrent bulging about 10 years ago.  Usually reducible not particularly bothersome.  Usually can massage it back in.  Rather physically active.  However he noticed it was stopped 48 hours ago.  Worsening pain.  Progressive to nausea and vomiting.  Obstipated with no bowel movements and not even flatus.  Concerned.  Came to emergency room.  Swollen red left groin noted.  Leukocytosis.  Not easily reducible.  Very suspicious for recurrent inguinal hernia.  Surgical consultation requested.   No exertional chest/neck/shoulder/arm pain.  Patient can walk 60 minutes for about 2 miles without difficulty.  Some history of irregular abdominal pain and irregular bowels.  IBS diagnosis suspected.  Patient did not really take Linzess.  Declined endoscopy.  Had seen Dr. Darrick Penna in the past.  That has not been a new issue.  Denies any alcohol tobacco or other drug use.    Assessment  [name]  68 y.o. male     Procedure(s): LAPAROSCOPIC BILATERAL INGUINAL HERNIA REPAIR POSSIBLE OPEN HERNIA REPAIR, POSSIBLE BOWEL  RESECTION, POSSIBLE COLOSTOMY  Problem List:  Principal Problem:   Strangulated inguinal hernia Active Problems:   Right inguinal hernia   Asthma   Nausea & vomiting   Hypokalemia   Left groin mass with erythema and pain highly suspicious for recurrent left inguinal hernia with strangulation.  Small but definite right inguinal hernia as well.  Plan:  Emergent operative exploration.  Diagnostic laparoscopy.  Probable reduction of hernia.  X-rays show dilated small bowel and not colon, most likely making this a small intestine incarceration which would explain his nausea and vomiting.  Most likely require small bowel resection.  Possible need for colectomy and colostomy as well.  We will try and do preperitoneal approach for some of it but may need more standard open approach.  Most likely will require temporary absorbable mesh (Phasix) or suture repair since risk of infection markedly increased.  Recurrence risk elevated without permanent mesh, but risk of infection much higher.  Health risks not certain given his fair compliance with seeing doctors, but he has decent exercise tolerance.  Other options are limited.  He agrees to proceed.  The anatomy & physiology of the abdominal wall and pelvic floor was discussed.  The pathophysiology of hernias in the inguinal and pelvic region was discussed.  Natural history risks such as progressive enlargement, pain, incarceration, and strangulation was discussed.   Contributors to complications such as smoking, obesity, diabetes, prior surgery, etc were discussed.    I feel the risks of no intervention will lead to serious problems that outweigh the operative risks; therefore, I recommended surgery to reduce and repair the  hernia.  I explained laparoscopic techniques with possible need for an open approach.  I noted usual use of mesh to patch and/or buttress hernia repair  Risks such as bleeding, infection, abscess, need for further treatment, injury to  other organs, need for repair of tissues / organs, stroke, heart attack, death, and other risks were discussed.  I noted a good likelihood this will help address the problem.   Goals of post-operative recovery were discussed as well.  Possibility that this will not correct all symptoms was explained.  I stressed the importance of low-impact activity, aggressive pain control, avoiding constipation, & not pushing through pain to minimize risk of post-operative chronic pain or injury. Possibility of reherniation was discussed.  We will work to minimize complications.     An educational handout further explaining the pathology & treatment options was given as well.  Questions were answered.  The patient expresses understanding & wishes to proceed with surgery.   -Hypokalemia.  Correcting.  Check magnesium level and correct if needed -Some remote history of asthma but not taking any medication.  Inhalers PRN.   Question of hyperlipidemia but not on medication.  Recommend outpatient follow-up -VTE prophylaxis- SCDs, etc -mobilize as tolerated to help recovery  40 minutes spent in review, evaluation, examination, counseling, and coordination of care.  More than 50% of that time was spent in counseling.  Shelda Truby C. Elliett Guarisco, MD, FACS, MASCRS [facility]    [address] [place], [place] [id number] [phone] Main / Paging [phone] Fax   03/18/2019      Past Medical History:  Diagnosis Date   Hyperlipidemia     Past Surgical History:  Procedure Laterality Date   INGUINAL HERNIA REPAIR Left 1991   open repair.  [place]   TONSILLECTOMY      Social History   Socioeconomic History   Marital status: Divorced    Spouse name: Not on file   Number of children: Not on file   Years of education: Not on file   Highest education level: Not on file  Occupational History   Not on file  Social Needs   Financial resource strain:  Not on file   Food insecurity:    Worry: Not on file    Inability: Not on file   Transportation needs:    Medical: Not on file    Non-medical: Not on file  Tobacco Use   Smoking status: Never Smoker   Smokeless tobacco: Never Used  Substance and Sexual Activity   Alcohol use: No   Drug use: No   Sexual activity: Not on file  Lifestyle   Physical activity:    Days per week: Not on file    Minutes per session: Not on file   Stress: Not on file  Relationships   Social connections:    Talks on phone: Not on file    Gets together: Not on file    Attends religious service: Not on file    Active member of club or organization: Not on file    Attends meetings of clubs or organizations: Not on file    Relationship status: Not on file   Intimate partner violence:    Fear of current or ex partner: Not on file    Emotionally abused: Not on file    Physically abused: Not on file    Forced sexual activity: Not on file  Other Topics Concern   Not on file  Social  History Narrative   Not on file    Family History  Problem Relation Age of Onset   Cancer Mother    Colon cancer Neg Hx     Current Facility-Administered Medications  Medication Dose Route Frequency Provider Last Rate Last Dose   [START ON 03/19/2019] acetaminophen (TYLENOL) tablet 1,000 mg  1,000 mg Oral On Call to OR Karie Soda, MD       bupivacaine liposome (EXPAREL) 1.3 % injection 266 mg  20 mL Infiltration Once Karie Soda, MD       [START ON 03/19/2019] ceFAZolin (ANCEF) IVPB 2g/100 mL premix  2 g Intravenous On Call to OR Karie Soda, MD       And   [START ON 03/19/2019] metroNIDAZOLE (FLAGYL) IVPB 500 mg  500 mg Intravenous On Call to OR Karie Soda, MD       Chlorhexidine Gluconate Cloth 2 % PADS 6 each  6 each Topical Once Karie Soda, MD       And   Chlorhexidine Gluconate Cloth 2 % PADS 6 each  6 each Topical Once Karie Soda, MD       [START ON 03/19/2019] gabapentin  (NEURONTIN) capsule 300 mg  300 mg Oral On Call to OR Karie Soda, MD       potassium chloride 10 mEq in 100 mL IVPB  10 mEq Intravenous Once Hyman Hopes, Margaux, PA-C 100 mL/hr at 03/18/19 1720 10 mEq at 03/18/19 1720   sodium chloride 0.9 % bolus 1,000 mL  1,000 mL Intravenous Once Hyman Hopes, Margaux, PA-C       No current outpatient medications on file.     Allergies  Allergen Reactions   Pork-Derived Products Nausea And Vomiting    ROS:   All other systems reviewed & are negative except per HPI or as noted below: Constitutional:  No fevers, chills, sweats.  Weight stable Eyes:  No vision changes, No discharge HENT:  No sore throats, nasal drainage Lymph: No neck swelling, No bruising easily Pulmonary:  No cough, productive sputum CV: No orthopnea, PND  Patient walks 60 minutes for about 2 miles without difficulty.  No exertional chest/neck/shoulder/arm pain. GI: No personal nor family history of GI/colon cancer, inflammatory bowel disease, allergy such as Celiac Sprue, dietary/dairy problems, colitis, ulcers nor gastritis.  No recent sick contacts/gastroenteritis.  No travel outside the country.  No changes in diet. Renal: No UTIs, No hematuria Genital:  No drainage, bleeding, masses Musculoskeletal: No severe joint pain.  Good ROM major joints Skin:  No sores or lesions.  No rashes Heme/Lymph:  No easy bleeding.  No swollen lymph nodes Neuro: No focal weakness/numbness.  No seizures Psych: No suicidal ideation.  No hallucinations  BP (!) 137/96 (BP Location: Right Arm)    Pulse (!) 112    Temp 97.7 F (36.5 C) (Oral)    Resp 18    Ht  (1.753 m)    Wt 72.6 kg    SpO2 96%    BMI 23.63 kg/m   Physical Exam: General: Pt awake/alert/oriented x4 in mild major acute distress Eyes: PERRL, normal EOM. Sclera nonicteric Neuro: CN II-XII intact w/o focal sensory/motor deficits. Lymph: No head/neck/groin lymphadenopathy Psych:  No delerium/psychosis/paranoia.   HENT: Normocephalic,  Mucus membranes moist.  No thrush.  Wearing glasses/aids. Neck: Supple, No tracheal deviation Chest: No pain.  Good respiratory excursion. CV:  Pulses intact.  Regular rhythm Abdomen: Soft, Mildly distended.  Nontender.   . Gen: Obvious left groin bulging with 7 x 7  area of erythema and pain.  Consistent with incarcerated hernia.  Cellulitis and time greater than 48 hours to raise likelihood of strangulation very high.  Testes and epididymides otherwise normal.  Small but definite right inguinal hernia easily reducible.  No inguinal hernias.  No inguinal lymphadenopathy.  Circumcised male.  Ext:  SCDs BLE.  No significant edema.  No cyanosis Skin: No petechiae / purpurea.  No major sores Musculoskeletal: No severe joint pain.  Good ROM major joints   Results:   Labs: Results for orders placed or performed during the hospital encounter of 03/18/19 (from the past 48 hour(s))  Lipase, blood     Status: None   Collection Time: 03/18/19  4:01 PM  Result Value Ref Range   Lipase 24 11 - 51 U/L    Comment: Performed at [facility], [address] [place], [place] [address]  Comprehensive metabolic panel     Status: Abnormal   Collection Time: 03/18/19  4:01 PM  Result Value Ref Range   Sodium 140 135 - 145 mmol/L   Potassium 2.4 (LL) 3.5 - 5.1 mmol/L    Comment: S.BINGHAM,RN [id number]  BY V.WILKINS   Chloride 93 (L) 98 - 111 mmol/L   CO2 32 22 - 32 mmol/L   Glucose, Bld 169 (H) 70 - 99 mg/dL   BUN 27 (H) 8 - 23 mg/dL   Creatinine, Ser 9.81 0.61 - 1.24 mg/dL   Calcium 9.2 8.9 - 19.1 mg/dL   Total Protein 9.1 (H) 6.5 - 8.1 g/dL   Albumin 4.6 3.5 - 5.0 g/dL   AST 29 15 - 41 U/L   ALT 33 0 - 44 U/L   Alkaline Phosphatase 65 38 - 126 U/L   Total Bilirubin 2.3 (H) 0.3 - 1.2 mg/dL   GFR calc non Af Amer >60 >60 mL/min   GFR calc Af Amer >60 >60 mL/min   Anion gap 15 5 - 15    Comment: Performed at [facility], [address], [place],  [place] [address]  CBC     Status: Abnormal   Collection Time: 03/18/19  4:01 PM  Result Value Ref Range   WBC 19.5 (H) 4.0 - 10.5 K/uL   RBC 5.55 4.22 - 5.81 MIL/uL   Hemoglobin 17.3 (H) 13.0 - 17.0 g/dL   HCT 56.2 13.0 - 86.5 %   MCV 90.6 80.0 - 100.0 fL   MCH 31.2 26.0 - 34.0 pg   MCHC 34.4 30.0 - 36.0 g/dL   RDW 78.4 69.6 - 29.5 %   Platelets 235 150 - 400 K/uL   nRBC 0.0 0.0 - 0.2 %    Comment: Performed at [facility], [address] [place], [place] [address]    Imaging / Studies: Dg Abd Acute W/chest  Result Date: 03/18/2019 CLINICAL DATA:  Left inguinal pain EXAM: DG ABDOMEN ACUTE W/ 1V CHEST COMPARISON:  11/13/2016 FINDINGS: There are multiple dilated loops of small bowel, predominantly within the left hemiabdomen. Multiple fluid levels. No free intraperitoneal air. Lungs are clear. IMPRESSION: Small-bowel obstruction. Electronically Signed   By: Deatra Robinson M.D.   On: 03/18/2019 17:39    Medications / Allergies: per chart  Antibiotics: Anti-infectives (From admission, onward)   Start     Dose/Rate Route Frequency Ordered Stop   03/19/19 0600  ceFAZolin (ANCEF) IVPB 2g/100 mL premix     2 g 200 mL/hr over 30 Minutes Intravenous On call to O.R. 03/18/19 1747 03/20/19 0559  03/19/19 0600  metroNIDAZOLE (FLAGYL) IVPB 500 mg     500 mg 100 mL/hr over 60 Minutes Intravenous On call to O.R. 03/18/19 1747 03/20/19 0559        Note: Portions of this report may have been transcribed using voice recognition software. Every effort was made to ensure accuracy; however, inadvertent computerized transcription errors may be present.   Any transcriptional errors that result from this process are unintentional.    Ardeth Sportsman, MD, FACS, MASCRS Gastrointestinal and Minimally Invasive Surgery    [address], [address] [place], [place] [address] [phone] Main / Paging [phone] Fax   03/18/2019

## 2019-03-18 NOTE — ED Triage Notes (Signed)
Patient c/o left inguinal hernia pain x 2 days. Patient c/o N/V, urinary hesitancy, and no BM x 2 days. Patient states he has used an enema and laxative with no results.

## 2019-03-18 NOTE — ED Notes (Signed)
2 PATIENT BELONGING BAGS PLACED IN [employer]. THIS RN MADE PATIENT AWARE OF WHERE BELONGINGS ARE PLACED. THIS RN MADE OR TRANSPORT STAFF AWARE OF WHERE BELONGINGS WERE PLACED.

## 2019-03-18 NOTE — Transfer of Care (Signed)
Immediate Anesthesia Transfer of Care Note  Patient: Omar Bell  Procedure(s) Performed: DIAGNOSTIC LAPAROSCOPY, SMALL BOWEL RESECTION, REPAIR OF LEFT STRANGULATED RECURRENT INGUINAL HERNIA WITH GANGRENE; RIGHT INGUINAL HERNIA REPAIR (Bilateral )  Patient Location: PACU  Anesthesia Type:General  Level of Consciousness: sedated, patient cooperative and responds to stimulation  Airway & Oxygen Therapy: Patient Spontanous Breathing and Patient connected to face mask oxygen  Post-op Assessment: Report given to RN and Post -op Vital signs reviewed and stable  Post vital signs: Reviewed and stable  Last Vitals:  Vitals Value Taken Time  BP 136/81 03/18/2019 10:55 PM  Temp    Pulse 98 03/18/2019 10:59 PM  Resp 18 03/18/2019 10:59 PM  SpO2 100 % 03/18/2019 10:59 PM  Vitals shown include unvalidated device data.  Last Pain:  Vitals:   03/18/19 1730  TempSrc:   PainSc: 6          Complications: No apparent anesthesia complications

## 2019-03-18 NOTE — ED Notes (Signed)
Pt with one large occurrence of emesis.  Emesis was brown liquid.  Pt reported drinking brown "smooth move" tea last night and this morning.  Pt assisted to change gown, sheets, provided with emesis bag.  Will continue to monitor.

## 2019-03-18 NOTE — ED Notes (Signed)
Pt informed of need for urine sample, provided a urinal.  

## 2019-03-18 NOTE — Anesthesia Preprocedure Evaluation (Addendum)
Anesthesia Evaluation  Patient identified by MRN, date of birth, ID band Patient awake    Reviewed: Allergy & Precautions, H&P , NPO status , Patient's Chart, lab work & pertinent test results  Airway Mallampati: II  TM Distance: >3 FB Neck ROM: Full    Dental no notable dental hx. (+) Teeth Intact, Dental Advisory Given   Pulmonary asthma ,    Pulmonary exam normal breath sounds clear to auscultation       Cardiovascular negative cardio ROS   Rhythm:Regular Rate:Normal     Neuro/Psych negative neurological ROS  negative psych ROS   GI/Hepatic negative GI ROS, Neg liver ROS,   Endo/Other  negative endocrine ROS  Renal/GU negative Renal ROS  negative genitourinary   Musculoskeletal   Abdominal   Peds  Hematology negative hematology ROS (+)   Anesthesia Other Findings   Reproductive/Obstetrics negative OB ROS                            Anesthesia Physical Anesthesia Plan  ASA: II and emergent  Anesthesia Plan: General   Post-op Pain Management:    Induction: Intravenous, Rapid sequence and Cricoid pressure planned  PONV Risk Score and Plan: 3 and Ondansetron, Dexamethasone and Midazolam  Airway Management Planned: Oral ETT  Additional Equipment:   Intra-op Plan:   Post-operative Plan: Extubation in OR  Informed Consent: I have reviewed the patients History and Physical, chart, labs and discussed the procedure including the risks, benefits and alternatives for the proposed anesthesia with the patient or authorized representative who has indicated his/her understanding and acceptance.     Dental advisory given  Plan Discussed with: CRNA  Anesthesia Plan Comments:         Anesthesia Quick Evaluation

## 2019-03-18 NOTE — Op Note (Addendum)
03/18/2019  10:37 PM  PATIENT:  Omar Bell  68 y.o. male  Patient Care Team: Benita Stabile, MD as PCP - General (Internal Medicine) [place], MD as Consulting Physician (Gastroenterology)  PRE-OPERATIVE DIAGNOSIS:   Recurrent incarcerated left inguinal hernia.  Probably strangulated. Right inguinal hernia  POST-OPERATIVE DIAGNOSIS:   -Recurrent left inguinal hernia incarcerated with gangrenous perforated  strangulated jejunum causing small bowel obstruction -Right indirect inguinal hernia -Right femoral hernia  PROCEDURE:  Laparoscopic lysis of adhesions. Small bowel resection (extracorporeal) Laparoscopic bilateral inguinal and right femoral hernia repairs with absorbable Phasix mesh (TAPP approach) TAP block  SURGEON:  Ardeth Sportsman, MD  ASSISTANT: OR Staff  ANESTHESIA:     Regional ilioinguinal and genitofemoral and spermatic cord nerve blocks  General  Nerve block provided with liposomal bupivacaine (Experel) mixed with 0.25% bupivacaine as a Bilateral TAP block x 30mL each side at the level of the transverse abdominis & preperitoneal spaces along the flank at the anterior axillary line, from subcostal ridge to iliac crest under laparoscopic guidance    EBL:  Total I/O In: 4500 [I.V.:4000; IV Piggyback:500] Out: 450 [Urine:150; Blood:300].  See anesthesia record  Delay start of Pharmacological VTE agent (>24hrs) due to surgical blood loss or risk of bleeding:  no  DRAINS: 23 [place] Blake drain goes from the left paramedian abdomen down into the suprapubic preperitoneal space  SPECIMEN: Jejunum with gangrene and perforation   DISPOSITION OF SPECIMEN:  N/A  COUNTS:  YES  PLAN OF CARE: Discharge to home after PACU  PATIENT DISPOSITION:  PACU - hemodynamically stable.  INDICATION: 68 year old male who tends not to see doctors.  History of left inguinal hernia repaired in open fashion when he lived in [place] almost 30 years ago.  Developed recurrence of  the left groin.  Usually reducible.  However became stuck and cannot be reduced 48 hours ago.  Increasing pain.  Nausea vomiting.  Came to the n emergency room.  Swelling left groin with cellulitis, concerning for a incarcerated inguinal hernia.  Possibly strangulated.  Recommendation made for emergent operative exploration  The anatomy & physiology of the abdominal wall and pelvic floor was discussed.  The pathophysiology of hernias in the inguinal and pelvic region was discussed.  Natural history risks such as progressive enlargement, pain, incarceration & strangulation was discussed.   Contributors to complications such as smoking, obesity, diabetes, prior surgery, etc were discussed.    I feel the risks of no intervention will lead to serious problems that outweigh the operative risks; therefore, I recommended surgery to reduce and repair the hernia.  I explained laparoscopic techniques with possible need for an open approach.  I noted usual use of mesh to patch and/or buttress hernia repair  Risks such as bleeding, infection, abscess, need for further treatment, heart attack, death, and other risks were discussed.  I noted a good likelihood this will help address the problem.   Goals of post-operative recovery were discussed as well.  Possibility that this will not correct all symptoms was explained.  I stressed the importance of low-impact activity, aggressive pain control, avoiding constipation, & not pushing through pain to minimize risk of post-operative chronic pain or injury. Possibility of reherniation was discussed.  We will work to minimize complications.     An educational handout further explaining the pathology & treatment options was given as well.  Questions were answered.  The patient expresses understanding & wishes to proceed with surgery.  OR FINDINGS: Very large LEFT  direct space hernia containing numerous loops of jejunum with frank gangrene and contained perforation.  Resected.   No indirect hernia.  No obvious femoral or obturator hernia.  On the right side large indirect inguinal hernia.  Not incarcerated.  No direct space hernia.  Medium size femoral hernia.  No obturator hernia.  TAPP underlay repair.  Absorbable Mesh was used: PhasixT Mesh (a knitted monofilament mesh scaffold using Poly-4-hydroxybutyrate (P4HB), a biologically derived, fully resorbable material).   DESCRIPTION:  Informed consent was confirmed.  The patient underwent general anaesthesia without difficulty.  The patient was positioned appropriately.  VTE prevention in place.  The patient's abdomen was clipped, prepped, & draped in a sterile fashion.  Surgical timeout confirmed our plan.  Peritoneal entry with a laparoscopic port was obtained using Varess spring needle entry technique in the left upper abdomen as the patient was positioned in reverse Trendelenburg.  I induced carbon dioxide insufflation.  No change in end tidal CO2 measurements.  Full symmetrical abdominal distention.  Initial port was carefully placed.  Camera inspection revealed no injury.  Extra ports were carefully placed under direct laparoscopic visualization.  I focused attention on the LEFT pelvis since that was the dominant hernia side.   Could see very dilated small bowel going into a large hernia in the left suprapubic region.  Decompressed ileum noted.  Is used blunt and focused sharp dissection to gradually reduce a few loops of bowel.  They were frankly gangrenous and very thin-walled.  Eventually was able to reduce and reveal gangrenous intestine with perforation into the hernia sac.  Consistent with a very large direct space hernia.  Patient had obvious right indirect inguinal hernia of moderate size as well.  I made a Pfannenstiel incision in the suprapubic region and placed a wound protector.  I eviscerated a large wad of small bowel with dense adhesions.  At least 40 cm of gangrenous small bowel.  Sharply lysed adhesions  to on cluster the wad of matted gangrenous intestine.  Found clear demarcation to dilated proximal jejunum and decompressed distal jejunum/proximal ileum.  I transected the mesentery with LigaSure vessel sealer to both ends.  I did a [place] type resection and anastomosis.  Used a 75 GIA to do a side-to-side anastomosis between the dilated but viable proximal small intestine and the decompressed distal small intestine.  Decompressed the dilated small bowel large volume until less distended.  Mucosa looked healthy and viable.  Stapled off the common staple defect with a TX 90 stapler.  Transected last corners of mesentery.  Closed the small intestine mesenteric defect transversely using interrupted silk sutures.  This helped pexy the TX staple line into the mesentery and protect it.  Looked healthy and viable.  Added tension crotch stitch with 2-0 silk on the other side of the anastomosis as well.  I had anesthesia switch from an orogastric to nasogastric tube given the obvious bowel distention and need for bowel anastomosis.  They were able to evacuate a fair volume of gastric contents and better decompress the stomach.  I returned the small intestine into the abdomen.  I did laparoscopic exploration.  We irrigated 6 L of serial aliquots to help clear out the contamination from the gangrenous perforated bowel.  Did a final irrigation of antibiotic solution (clindamycin/gentamicin).  I inspected the small bowel from the ileocecal valve proximally to the ligament of Treitz.  Saw no other abnormalities and or twisting or torsion.  Colon from cecum to peritoneal reflection of the mid  rectum was viable without any other abnormalities.  I aspirated the antibiotic irrigation after it been in place for 15 minutes.    Then decided to do a preperitoneal repair, TAPP approach.  Because it was a recurrent left and quite fixed I did not think suture repair would be adequate.  Was not appropriate to use permanent mesh.   Therefore I used Phaisx absorbable mesh.  The wound protector was removed & wound clamped the skin.   I scored the peritoneum infraumbilically and a broad transverse fashion from the right flank to the left flank.  I used blunt & focused sharp dissection to free the peritoneum off the flank and down to the pubic rim.  Focused on the left side.  I freed the anteriolateral bladder wall off the anteriolateral pelvic wall, sparing midline attachments.   I located a swath of peritoneum going into a hernia fascial defect at the direct space consistent with a direct space inguinal hernia..  I gradually freed the peritoneal hernia sac off safely and reduced it into the preperitoneal space.  I freed the peritoneum off the spermatic vessels & vas deferens.  I freed peritoneum off the retroperitoneum along the psoas muscle.  Spermatic cord lipoma was dissected away & removed.  I checked & assured hemostasis.  The peritoneum did have some ischemia but there was no frank gangrene on it, so I left it in place.  I turned attention on the opposite RIGHT pelvis.  I did dissection in a similar, mirror-image fashion. The patient had an indirect inguinal hernia.   Also large.  Spermatic cord lipoma was dissected away & removed.    I checked & assured hemostasis.     I chose 15x10 cm sheets of Phasix mesh x2 for the smaller right side as that what was available in the middle of the night.   I cut a single sigmoid-shaped slit ~6cm from a corner of 1 sheet of mesh.  The other mesh I left uncut.  I placed the meshes into the preperitoneal space & laid them such that one flap was medial and vertical with a 6x6 cm corner flap rested in the true anterolateral pelvis, covering the obturator & femoral foramina.   The other uncut mesh laid transversely to overlap over the direct space and had good lateral coverage of the indirect inguinal hernia.  I did use an absorbable SecureStrap tacker to help tack the mesh to the pubic rim, rectus  muscle, superiorly, and superolaterally.   I used a 20 x 15 cm sheet of Phasix mesh for the left side since it was much larger and that was what was available.  Ended up having a more broad medial slit since it was a large direct space hernia.  Placed in the preperitoneal space.  Plated such there was a good lateral coverage of the internal ring.  The mesh overlapped well from the 2 meshes on the right side.  Again absorbable tacker used to help tack along and above the pubic rim as well as rectus and superiorly and superolaterally.  I then tacked the peritoneum back up as well.  Provide good overlap.  Again I used an absorbable mesh since I cannot use permanent mesh in the setting of gangrene and perforation.   I allowed the bladder to return to the pubis, this helping tuck the corners of the mesh in the anteriolateral pelvis.  The medial corners overlapped each other across midline cephalad to the pubic rim.   This provided >  2 inch coverage around the hernias.  I held the hernia sacs cephalad & evacuated carbon dioxide.  Ports were removed.   I directed a 85 [place] Blake drain from an instill wound out of the the left paramedian port site with the help of a laparoscopic blunt grasper through the port.  Secured the drain to the skin with Prolene suture.  Placed the end of the drain into the preperitoneal space to decompress the hernia sacs and minimize the risk of abscess formation.  I closed the port sites closed with 4-0 monocryl stitch.  Pfannenstiel wound closed with 0 Vicryl posterior rectus/peritoneal vertical closure.  Anterior rectus fascia closed transversely with #1 running PDS suture. starting from each corner.  Skin closed with Monocryl suture.  Because of the gangrene and perforation I did place umbilical tape wicks at the corners of the wound.  Sterile dressings were applied.   The patient was extubated & arrived in the PACU in stable condition..  I made an attempt to locate family to discuss  patient's status and recommendations.  No one is available at this time.  Called [phone] with no answer.  We will try later   Ardeth Sportsman, M.D., F.A.C.S. Gastrointestinal and Minimally Invasive Surgery [facility] [address], [address] [place], [place] [address] [phone] Main / Paging  03/18/2019 10:37 PM

## 2019-03-18 NOTE — ED Provider Notes (Signed)
[facility]-EMERGENCY DEPT Provider Note   CSN: [id number] Arrival date & time: 03/18/19  1522    History   Chief Complaint Chief Complaint  Patient presents with  . hernia pain    HPI Omar Bell is a 68 y.o. male who presents to the ED complaining of gradual onset, constant, sharp, 10/10, left groin pain and mass x 2 days. Pt reports hx of bilateral inguinal hernias that intermittently pop out and when they do he describes them as a "rock hard mass" that typically reduces by itself in 1 day. He reports his mass is similar today although it has not reduced by itself; causing him concern. He also complains of nausea, mild diffuse abdominal discomfort, constipation, and obstipation. Last bowel movement 2 days ago - pt has tried OTC enemas without relief. He states he typically has these symptoms when his hernia comes out. Pt had previous repair of left inguinal hernia in 1991 but no repairs since. He saw his PCP in 2017 complaining of same; was recommended to a surgeon but thought he could deal with the pain on his own as it was only intermittent. Denies fever, chills, dysuria, frequency, vomiting, hematochezia, melena, or any other associated symptoms.        Past Medical History:  Diagnosis Date  . Hyperlipidemia     Patient Active Problem List   Diagnosis Date Noted  . Strangulated inguinal hernia 03/18/2019  . Right inguinal hernia 03/18/2019  . Asthma 03/18/2019  . Nausea & vomiting 03/18/2019  . Hypokalemia 03/18/2019  . Hyperlipidemia   . Abdominal pain 11/12/2016    Past Surgical History:  Procedure Laterality Date  . INGUINAL HERNIA REPAIR Left 1991   open repair.  [place]  . TONSILLECTOMY          Home Medications    Prior to Admission medications   Not on File    Family History Family History  Problem Relation Age of Onset  . Cancer Mother   . Colon cancer Neg Hx     Social History Social History   Tobacco Use  . Smoking  status: Never Smoker  . Smokeless tobacco: Never Used  Substance Use Topics  . Alcohol use: No  . Drug use: No     Allergies   Pork-derived products   Review of Systems Review of Systems  Constitutional: Negative for chills and fever.  HENT: Negative for congestion.   Eyes: Negative for redness.  Respiratory: Negative for cough and shortness of breath.   Cardiovascular: Negative for chest pain.  Gastrointestinal: Positive for abdominal pain, constipation and nausea. Negative for blood in stool, diarrhea and vomiting.  Genitourinary: Negative for dysuria, frequency and hematuria.       + left groin pain  Musculoskeletal: Negative for myalgias.  Skin: Negative for rash.  Neurological: Negative for headaches.     Physical Exam Updated Vital Signs BP (!) 137/96 (BP Location: Right Arm)   Pulse (!) 112   Temp 97.7 F (36.5 C) (Oral)   Resp 18   Ht 5\' 9"  (1.753 m)   Wt 72.6 kg   SpO2 96%   BMI 23.63 kg/m   Physical Exam Vitals signs and nursing note reviewed. Exam conducted with a chaperone present.  Constitutional:      Appearance: He is not ill-appearing.     Comments: Laying comfortably on bed  HENT:     Head: Normocephalic and atraumatic.     Mouth/Throat:     Mouth: Mucous membranes  are dry.  Eyes:     Conjunctiva/sclera: Conjunctivae normal.  Neck:     Musculoskeletal: Neck supple.  Cardiovascular:     Rate and Rhythm: Normal rate and regular rhythm.     Pulses: Normal pulses.  Pulmonary:     Effort: Pulmonary effort is normal.     Breath sounds: Normal breath sounds. No wheezing, rhonchi or rales.  Abdominal:     General: There is no distension.     Palpations: Abdomen is soft.     Tenderness: There is abdominal tenderness.     Comments: Mild diffuse abdominal TTP; no rebound or guarding  Genitourinary:    Comments: Chaperone present; 4 x 4 cm left inguinal hernia with erythema; mild tenderness to palpation; unable to reduce easily; hernia does not  extend into scrotum; no tenderness to testes.  Skin:    General: Skin is warm and dry.  Neurological:     Mental Status: He is alert.      ED Treatments / Results  Labs (all labs ordered are listed, but only abnormal results are displayed) Labs Reviewed  COMPREHENSIVE METABOLIC PANEL - Abnormal; Notable for the following components:      Result Value   Potassium 2.4 (*)    Chloride 93 (*)    Glucose, Bld 169 (*)    BUN 27 (*)    Total Protein 9.1 (*)    Total Bilirubin 2.3 (*)    All other components within normal limits  CBC - Abnormal; Notable for the following components:   WBC 19.5 (*)    Hemoglobin 17.3 (*)    All other components within normal limits  LIPASE, BLOOD  PREALBUMIN  URINALYSIS, ROUTINE W REFLEX MICROSCOPIC  MAGNESIUM  TROPONIN I    EKG EKG Interpretation  Date/Time:  Friday March 18 2019 17:16:34 EDT Ventricular Rate:  108 PR Interval:    QRS Duration: 158 QT Interval:  383 QTC Calculation: 514 R Axis:   73 Text Interpretation:  Sinus tachycardia Probable left atrial enlargement Right bundle branch block ST depr, consider ischemia, anterolateral lds No old tracing to compare Confirmed by Benjiman Core [phone]) on 03/18/2019 5:21:49 PM   Radiology Dg Abd Acute W/chest  Result Date: 03/18/2019 CLINICAL DATA:  Left inguinal pain EXAM: DG ABDOMEN ACUTE W/ 1V CHEST COMPARISON:  11/13/2016 FINDINGS: There are multiple dilated loops of small bowel, predominantly within the left hemiabdomen. Multiple fluid levels. No free intraperitoneal air. Lungs are clear. IMPRESSION: Small-bowel obstruction. Electronically Signed   By: Deatra Robinson M.D.   On: 03/18/2019 17:39    Procedures Procedures (including critical care time)  Medications Ordered in ED Medications  sodium chloride 0.9 % bolus 1,000 mL ( Intravenous MAR Hold 03/18/19 1807)  Chlorhexidine Gluconate Cloth 2 % PADS 6 each ( Topical Automatically Held 03/18/19 2200)    And  Chlorhexidine  Gluconate Cloth 2 % PADS 6 each ( Topical MAR Hold 03/18/19 1807)  acetaminophen (TYLENOL) tablet 1,000 mg ( Oral Automatically Held 03/18/19 1830)  ceFAZolin (ANCEF) IVPB 2g/100 mL premix ( Intravenous Automatically Held 03/18/19 1845)    And  metroNIDAZOLE (FLAGYL) IVPB 500 mg ( Intravenous Automatically Held 03/18/19 1845)  bupivacaine liposome (EXPAREL) 1.3 % injection 266 mg ( Infiltration MAR Hold 03/18/19 1807)  gabapentin (NEURONTIN) 300 MG capsule (has no administration in time range)  metroNIDAZOLE (FLAGYL) 5-0.79 MG/ML-% IVPB (has no administration in time range)  ceFAZolin (ANCEF) 2-4 GM/100ML-% IVPB (has no administration in time range)  lactated ringers infusion (  Intravenous New Bag/Given 03/18/19 1835)  potassium chloride 10 mEq in 100 mL IVPB (10 mEq Intravenous New Bag/Given 03/18/19 1835)  clindamycin (CLEOCIN) 900 mg, gentamicin (GARAMYCIN) 240 mg in sodium chloride 0.9 % 1,000 mL for intraperitoneal lavage (has no administration in time range)  sodium chloride flush (NS) 0.9 % injection 3 mL (3 mLs Intravenous Given 03/18/19 1559)  ondansetron (ZOFRAN-ODT) disintegrating tablet 8 mg (8 mg Oral Given 03/18/19 1641)  morphine 4 MG/ML injection 4 mg (4 mg Intravenous Given 03/18/19 1638)  potassium chloride 10 mEq in 100 mL IVPB (10 mEq Intravenous New Bag/Given 03/18/19 1720)  gabapentin (NEURONTIN) capsule 300 mg ( Oral Automatically Held 03/19/19 0600)  acetaminophen (TYLENOL) 500 MG tablet (1,000 mg  Given 03/18/19 1816)     Initial Impression / Assessment and Plan / ED Course  I have reviewed the triage vital signs and the nursing notes.  Pertinent labs & imaging results that were available during my care of the patient were reviewed by me and considered in my medical decision making (see chart for details).    Pt is a 68 year old male who presents to the ED with left inguinal hernia x 2 days. Hx of inguinal hernias bilaterally; reports this is similar although typically able to  reduce on their own which has not occurred today. Large inguinal mass on physical exam that is hard and erythematous; pt also diffusely tender throughout abdomen. Constipation and obstipation x 2 days; vomited once while in the ED but initially denied any vomiting at home. Pain medication and antiemetics ordered as well as baseline labs. Will attempt to reduce hernia with attending physician Dr. Rubin Payor prior to consulting general surgery.    Ice pack placed on hernia site and pt placed in trendelenburg. Dr. Rubin Payor attempted to reduce hernia multiple times without success; will consult general surgery as pt likely has incarcerated hernia. AAS ordered as pt having constipation and not passing gas. Pt with leukocytosis of 19; likely due to incarceration. Hypokalemia of 2.4; will supplement   5:07 PM Discussed case with general surgeon, Dr. Michaell Cowing, who will come and evaluate patient. AAS with small bowel obstruction which is consistent with pt not passing gas and vomiting.   5:40 PM Dr. Michaell Cowing to take patient to the OR for bilateral inguinal hernia repair with possible small intestine colectomy given SBO on acute abdominal series. Pre-op labs ordered as well as OR antibiotics.        Final Clinical Impressions(s) / ED Diagnoses   Final diagnoses:  Incarcerated inguinal hernia, unilateral  SBO (small bowel obstruction) [facility])    ED Discharge Orders    None       Tanda Rockers, PA-C 03/18/19 2312    Benjiman Core, MD 03/19/19 [phone]

## 2019-03-18 NOTE — Anesthesia Procedure Notes (Signed)
Procedure Name: Intubation Performed by: Gean Maidens, CRNA Pre-anesthesia Checklist: Patient identified, Emergency Drugs available, Suction available, Patient being monitored and Timeout performed Patient Re-evaluated:Patient Re-evaluated prior to induction Oxygen Delivery Method: Circle system utilized Preoxygenation: Pre-oxygenation with 100% oxygen Induction Type: IV induction and Rapid sequence Laryngoscope Size: Mac and 4 Grade View: Grade I Tube type: Oral Tube size: 7.5 mm Number of attempts: 1 Airway Equipment and Method: Stylet Placement Confirmation: ETT inserted through vocal cords under direct vision,  positive ETCO2 and breath sounds checked- equal and bilateral Secured at: 23 cm Tube secured with: Tape Dental Injury: Teeth and Oropharynx as per pre-operative assessment

## 2019-03-19 DIAGNOSIS — Z5320 Procedure and treatment not carried out because of patient's decision for unspecified reasons: Secondary | ICD-10-CM | POA: Insufficient documentation

## 2019-03-19 DIAGNOSIS — K4041 Unilateral inguinal hernia, with gangrene, recurrent: Secondary | ICD-10-CM

## 2019-03-19 DIAGNOSIS — Z91199 Patient's noncompliance with other medical treatment and regimen due to unspecified reason: Secondary | ICD-10-CM

## 2019-03-19 DIAGNOSIS — Z9119 Patient's noncompliance with other medical treatment and regimen: Secondary | ICD-10-CM

## 2019-03-19 DIAGNOSIS — K631 Perforation of intestine (nontraumatic): Secondary | ICD-10-CM

## 2019-03-19 DIAGNOSIS — K56609 Unspecified intestinal obstruction, unspecified as to partial versus complete obstruction: Secondary | ICD-10-CM

## 2019-03-19 LAB — CBC
HCT: 44.5 % (ref 39.0–52.0)
Hemoglobin: 14.8 g/dL (ref 13.0–17.0)
MCH: 30.8 pg (ref 26.0–34.0)
MCHC: 33.3 g/dL (ref 30.0–36.0)
MCV: 92.7 fL (ref 80.0–100.0)
Platelets: 185 10*3/uL (ref 150–400)
RBC: 4.8 MIL/uL (ref 4.22–5.81)
RDW: 13.2 % (ref 11.5–15.5)
WBC: 2.7 10*3/uL — ABNORMAL LOW (ref 4.0–10.5)
nRBC: 0 % (ref 0.0–0.2)

## 2019-03-19 LAB — BASIC METABOLIC PANEL
Anion gap: 10 (ref 5–15)
BUN: 25 mg/dL — ABNORMAL HIGH (ref 8–23)
CO2: 25 mmol/L (ref 22–32)
Calcium: 6.8 mg/dL — ABNORMAL LOW (ref 8.9–10.3)
Chloride: 105 mmol/L (ref 98–111)
Creatinine, Ser: 1.24 mg/dL (ref 0.61–1.24)
GFR calc Af Amer: >60 mL/min (ref >60)
GFR calc non Af Amer: 59 mL/min — ABNORMAL LOW (ref >60)
Glucose, Bld: 122 mg/dL — ABNORMAL HIGH (ref 70–99)
Potassium: 2.4 mmol/L — CL (ref 3.5–5.1)
Sodium: 140 mmol/L (ref 135–145)

## 2019-03-19 LAB — MAGNESIUM
Magnesium: 2.7 mg/dL — ABNORMAL HIGH (ref 1.7–2.4)
Magnesium: 2.2 mg/dL (ref 1.7–2.4)

## 2019-03-19 LAB — TROPONIN I
Troponin I: 0.03 ng/mL (ref <0.03)
Troponin I: <0.03 ng/mL (ref <0.03)

## 2019-03-19 MED ORDER — ONDANSETRON HCL 4 MG/2ML IJ SOLN
4.0000 mg | Freq: Four times a day (QID) | INTRAMUSCULAR | Status: DC | PRN
  Administered 2019-03-19 – 2019-03-26 (×3): 4 mg via INTRAVENOUS
  Filled 2019-03-19 (×3): qty 2

## 2019-03-19 MED ORDER — ACETAMINOPHEN 650 MG RE SUPP: 650.0000 mg | Freq: Four times a day (QID) | RECTAL | Status: DC | PRN

## 2019-03-19 MED ORDER — HYDROMORPHONE HCL 1 MG/ML IJ SOLN
0.5000 mg | INTRAMUSCULAR | Status: DC | PRN
  Administered 2019-03-24: 22:00:00 0.5 mg via INTRAVENOUS
  Administered 2019-03-27: 05:00:00 1 mg via INTRAVENOUS
  Filled 2019-03-19 (×2): qty 1

## 2019-03-19 MED ORDER — LIP MEDEX EX OINT
1.0000 "application " | TOPICAL_OINTMENT | Freq: Two times a day (BID) | CUTANEOUS | Status: DC
  Administered 2019-03-19 – 2019-04-04 (×30): 1 via TOPICAL
  Filled 2019-03-19 (×9): qty 7

## 2019-03-19 MED ORDER — METHOCARBAMOL 1000 MG/10ML IJ SOLN
1000.0000 mg | Freq: Four times a day (QID) | INTRAVENOUS | Status: DC | PRN
  Filled 2019-03-19: qty 10

## 2019-03-19 MED ORDER — MAGIC MOUTHWASH
15.0000 mL | Freq: Four times a day (QID) | ORAL | Status: DC | PRN
  Filled 2019-03-19: qty 15

## 2019-03-19 MED ORDER — SIMETHICONE 80 MG PO CHEW
40.0000 mg | CHEWABLE_TABLET | Freq: Four times a day (QID) | ORAL | Status: DC | PRN
  Administered 2019-03-19 – 2019-03-23 (×3): 40 mg via ORAL
  Filled 2019-03-19 (×3): qty 1

## 2019-03-19 MED ORDER — ALBUTEROL SULFATE (2.5 MG/3ML) 0.083% IN NEBU
3.0000 mL | INHALATION_SOLUTION | RESPIRATORY_TRACT | Status: DC | PRN
  Administered 2019-03-22: 3 mL via RESPIRATORY_TRACT
  Filled 2019-03-19: qty 3

## 2019-03-19 MED ORDER — ENOXAPARIN SODIUM 40 MG/0.4ML ~~LOC~~ SOLN: 40.0000 mg | SUBCUTANEOUS | Status: DC

## 2019-03-19 MED ORDER — LORAZEPAM 2 MG/ML IJ SOLN
0.5000 mg | Freq: Three times a day (TID) | INTRAMUSCULAR | Status: DC | PRN
  Administered 2019-03-19: 23:00:00 0.5 mg via INTRAVENOUS
  Filled 2019-03-19 (×2): qty 1

## 2019-03-19 MED ORDER — BISACODYL 10 MG RE SUPP: 10.0000 mg | Freq: Every day | RECTAL | Status: DC | PRN

## 2019-03-19 MED ORDER — SODIUM CHLORIDE 0.9 % IV SOLN
8.0000 mg | Freq: Four times a day (QID) | INTRAVENOUS | Status: DC | PRN
  Filled 2019-03-19: qty 4

## 2019-03-19 MED ORDER — HYDRALAZINE HCL 20 MG/ML IJ SOLN: 5.0000 mg | INTRAMUSCULAR | Status: DC | PRN

## 2019-03-19 MED ORDER — POTASSIUM CHLORIDE 10 MEQ/100ML IV SOLN
10.0000 meq | INTRAVENOUS | Status: AC
  Administered 2019-03-19 (×4): 10 meq via INTRAVENOUS
  Filled 2019-03-19: qty 100

## 2019-03-19 MED ORDER — PROCHLORPERAZINE EDISYLATE 10 MG/2ML IJ SOLN: 5.0000 mg | INTRAMUSCULAR | Status: DC | PRN

## 2019-03-19 MED ORDER — ACETAMINOPHEN 500 MG PO TABS: 1000.0000 mg | ORAL_TABLET | Freq: Three times a day (TID) | ORAL | Status: DC

## 2019-03-19 MED ORDER — PIPERACILLIN-TAZOBACTAM 3.375 G IVPB
3.3750 g | Freq: Three times a day (TID) | INTRAVENOUS | Status: AC
  Administered 2019-03-19 – 2019-03-23 (×14): 3.375 g via INTRAVENOUS
  Filled 2019-03-19 (×14): qty 50

## 2019-03-19 MED ORDER — LACTATED RINGERS IV BOLUS
1000.0000 mL | Freq: Three times a day (TID) | INTRAVENOUS | Status: AC | PRN
  Administered 2019-03-19: 16:00:00 1000 mL via INTRAVENOUS

## 2019-03-19 MED ORDER — LACTATED RINGERS IV BOLUS
1000.0000 mL | Freq: Once | INTRAVENOUS | Status: AC
  Administered 2019-03-19: 08:00:00 1000 mL via INTRAVENOUS

## 2019-03-19 MED ORDER — METOPROLOL TARTRATE 5 MG/5ML IV SOLN: 5.0000 mg | Freq: Four times a day (QID) | INTRAVENOUS | Status: DC | PRN

## 2019-03-19 MED ORDER — DIPHENHYDRAMINE HCL 50 MG/ML IJ SOLN: 12.5000 mg | Freq: Four times a day (QID) | INTRAMUSCULAR | Status: DC | PRN

## 2019-03-19 MED ORDER — POTASSIUM CHLORIDE 10 MEQ/100ML IV SOLN
10.0000 meq | INTRAVENOUS | Status: DC | PRN
  Administered 2019-03-20: 11:00:00 10 meq via INTRAVENOUS

## 2019-03-19 MED ORDER — LACTATED RINGERS IV SOLN
1000.0000 mL | Freq: Three times a day (TID) | INTRAVENOUS | Status: DC | PRN
  Administered 2019-03-19: 03:00:00 1000 mL via INTRAVENOUS

## 2019-03-19 MED ORDER — SODIUM CHLORIDE 0.9 % IV SOLN
INTRAVENOUS | Status: DC
  Administered 2019-03-19 – 2019-03-20 (×3): via INTRAVENOUS

## 2019-03-19 MED ORDER — NAPHAZOLINE-GLYCERIN 0.012-0.2 % OP SOLN: 1.0000 [drp] | Freq: Four times a day (QID) | OPHTHALMIC | Status: DC | PRN

## 2019-03-19 MED ORDER — PIPERACILLIN-TAZOBACTAM 3.375 G IVPB: 3.3750 g | Freq: Three times a day (TID) | INTRAVENOUS | Status: DC

## 2019-03-19 MED ORDER — ENOXAPARIN SODIUM 40 MG/0.4ML ~~LOC~~ SOLN
40.0000 mg | SUBCUTANEOUS | Status: DC
  Administered 2019-03-20 – 2019-04-03 (×15): 40 mg via SUBCUTANEOUS
  Filled 2019-03-19 (×16): qty 0.4

## 2019-03-19 NOTE — Progress Notes (Signed)
SBP <90. Pt sleeping with no acute symptoms. 1 liter bolus of LR given per prn order.

## 2019-03-19 NOTE — Progress Notes (Signed)
Dr Michaell Cowing aware on rounds this am. Pt received LR bolus on night shift and again this am per Dr. Michaell Cowing. Continuing to monitor according to MEWS protocol.

## 2019-03-19 NOTE — Progress Notes (Signed)
BP improved since last LR bolus. SBP 98.

## 2019-03-19 NOTE — Progress Notes (Signed)
Attempted  to flush NG tube  with air but was unsuccessful, RN tried to flush it again with water but a resistance was felt. Report given to day shift nurse Gunnar Fusi. We will continue to monitor.

## 2019-03-19 NOTE — Progress Notes (Addendum)
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 Active Problems:   Perforation of small intestine s/p SB resection 03/19/2019   Personal history of noncompliance with medical treatment   Abdominal pain   Strangulated recurrent left inguinal hernia s/p lap repair 03/19/2019   Right inguinal hernia s/p lap repair 03/19/2019   Asthma   Nausea & vomiting   Hypokalemia   SBO (small bowel obstruction) from hernia   1 Day Post-Op  03/18/2019  POST-OPERATIVE DIAGNOSIS:   -Recurrent left inguinal hernia incarcerated with gangrenous perforated jejunum causing small bowel obstruction -Right indirect inguinal hernia -Right femoral hernia  PROCEDURE:  Laparoscopic lysis of adhesions. Small bowel resection Laparoscopic bilateral inguinal and right femoral hernia repairs with absorbable Phasix mesh (TAPP approach) TAP block  SURGEON:  Ardeth Sportsman, MD  Assessment  Guarded  [facility] Stay = 1 days)  Plan:  -NG tube not flushing.  Pulled back.  Was kinked off.  He retched it out his mouth.  I removed.  Replace if he has worsening nausea or vomiting.  I would use a larger NG tube next time around.  18 [place].  Help keep it from kinking.  -Solitary mildly elevated troponin most likely cardiac strain and no evidence of myocardial infarction.  Repeat normal this morning.  -Check labs.  Make sure he is not developing any kidney failure or significant  anemia  -Recheck potassium.  Hypokalemia replaced perioperatively but was less than 3.  Low threshold to replaced as needed.  Magnesium within normal limits at admission.  Recheck.  IV fluid bolus.  He is positive for liters and I's and O's but comes in very dehydrated, hypokalemic, third spacing.  -IV antibiotics for 5 days postop given gangrene and perforation.  Zosyn.  -History of asthma.  Albuterol inhaler as needed.  -VTE prophylaxis- SCDs, etc. we will do Lovenox.  Low threshold to hold if unstable or rapidly dropping hemoglobin.  -Continue drain in preperitoneal space to help decompress the region and avoid large hematoma which is a significant risk in this situation.  -mobilize as tolerated to help recovery.  Try to chair today until tachycardia and mild shock resolved   35 minutes spent in review, evaluation, examination, counseling, and coordination of care.  More than 50% of that time was spent in counseling.  03/19/2019    Subjective: (Chief complaint)  Denies much pain.  Nasogastric tube with no output and will not flush.  Nursing suspected it is kinked off.  Wants to get up and shave.  Objective:  Vital signs:  Vitals:   03/19/19 0106 03/19/19 0219 03/19/19 0424 03/19/19 0614  BP: 96/70 98/85 94/65  92/65  Pulse: (!) 106 (!) 114 (!) 117 (!) 123  Resp: 15 16 15 16   Temp:  97.6 F (36.4 C) 97.8 F (36.6 C) 98.5 F (36.9 C)  TempSrc:  Oral Oral Oral  SpO2: 99% 98% 96% 96%  Weight:      Height:           Intake/Output  Yesterday:  04/10 0701 - 04/11 0700 In: 5830.4 [I.V.:5286.2; IV Piggyback:544.2] Out: 730 [Urine:375; Drains:55; Blood:300] This shift:  No intake/output data recorded.  Bowel function:  Flatus: No  BM:  No  Drain: Serosanguinous (in preperitoneal space suprapubically)   Physical Exam:  General: Pt awake/alert/oriented x4 in no acute distress Eyes: PERRL, normal EOM.  Sclera clear.  No icterus Neuro: CN II-XII intact w/o  focal sensory/motor deficits. Lymph: No head/neck/groin lymphadenopathy Psych:  No delerium/psychosis/paranoia HENT: Normocephalic, Mucus membranes moist.  No thrush Neck: Supple, No tracheal deviation Chest: No chest wall pain w good excursion CV:  Pulses intact.  Regular rhythm MS: Normal AROM mjr joints.  No obvious deformity  Abdomen: Soft.  Moderately distended.  Mildly tender at incisions only.  No evidence of peritonitis.  No incarcerated hernias.  GU: Ecchymosis and bruising at left groin and upper scrotum.  Actually improved from preop.  Mild edema but soft.  No giant hematoma or seroma.  Less swelling in right groin.  No obvious recurrent hernia.  Foley catheter in place.  Dark yellow/light tan urine  Ext:  No deformity.  No mjr edema.  No cyanosis Skin: No petechiae / purpura  Results:   Labs: Results for orders placed or performed during the hospital encounter of 03/18/19 (from the past 48 hour(s))  Lipase, blood     Status: None   Collection Time: 03/18/19  4:01 PM  Result Value Ref Range   Lipase 24 11 - 51 U/L    Comment: Performed at [facility], [address] [place], [place] [address]  Comprehensive metabolic panel     Status: Abnormal   Collection Time: 03/18/19  4:01 PM  Result Value Ref Range   Sodium 140 135 - 145 mmol/L   Potassium 2.4 (LL) 3.5 - 5.1 mmol/L    Comment: [facility] [id number] @1649  BY V.WILKINS   Chloride 93 (L) 98 - 111 mmol/L   CO2 32 22 - 32 mmol/L   Glucose, Bld 169 (H) 70 - 99 mg/dL   BUN 27 (H) 8 - 23 mg/dL   Creatinine, Ser 9.811.17 0.61 - 1.24 mg/dL   Calcium 9.2 8.9 - 19.110.3 mg/dL   Total Protein 9.1 (H) 6.5 - 8.1 g/dL   Albumin 4.6 3.5 - 5.0 g/dL   AST 29 15 - 41 U/L   ALT 33 0 - 44 U/L   Alkaline Phosphatase 65 38 - 126 U/L   Total Bilirubin 2.3 (H) 0.3 - 1.2 mg/dL   GFR calc non Af Amer >60 >60 mL/min   GFR calc Af Amer >60 >60 mL/min   Anion gap 15 5 - 15    Comment: Performed at [facility], [address] [place], [place] [address]  CBC     Status: Abnormal   Collection Time: 03/18/19  4:01 PM  Result Value Ref Range   WBC 19.5 (H) 4.0 - 10.5 K/uL   RBC 5.55 4.22 - 5.81 MIL/uL   Hemoglobin 17.3 (H) 13.0 - 17.0 g/dL   HCT 56.250.3 13.039.0 - 86.552.0 %   MCV 90.6 80.0 - 100.0 fL   MCH 31.2 26.0 - 34.0 pg   MCHC 34.4 30.0 - 36.0 g/dL   RDW 78.412.6 69.611.5 - 29.515.5 %   Platelets 235 150 - 400 K/uL   nRBC 0.0 0.0 - 0.2 %    Comment: Performed at [facility], [address] [place], [place] [address]  Prealbumin     Status:  None   Collection Time: 03/18/19  4:01 PM  Result Value Ref Range   Prealbumin 19.2 18 - 38 mg/dL    Comment: Performed at [facility], [address], [place], [place] [address]  Magnesium     Status: Abnormal   Collection Time: 03/18/19 11:18 PM  Result Value Ref Range   Magnesium 2.7 (H) 1.7 - 2.4 mg/dL    Comment: Performed at [facility], [address], [place], [place] [address]  Troponin I - Once     Status: Abnormal   Collection Time: 03/18/19 11:18 PM  Result Value Ref Range   Troponin I 0.03 (HH) <0.03 ng/mL    Comment: CRITICAL RESULT CALLED TO, READ BACK BY AND VERIFIED WITH: Otis Brace RN 0021 03/19/2019 HILL K Performed at [facility], [address] [place], [place] [address]   Troponin I - Tomorrow AM 0500     Status: None   Collection Time: 03/19/19  2:46 AM  Result Value Ref Range   Troponin I <0.03 <0.03 ng/mL    Comment: Performed at [facility], [address], [place], [place] [address]  CBC     Status: Abnormal   Collection Time: 03/19/19  7:59 AM  Result Value Ref Range   WBC 2.7 (L) 4.0 - 10.5 K/uL   RBC 4.80 4.22 - 5.81 MIL/uL   Hemoglobin 14.8 13.0 - 17.0 g/dL   HCT 29.5 62.1 - 30.8 %   MCV 92.7 80.0 - 100.0 fL   MCH 30.8 26.0 - 34.0 pg   MCHC 33.3 30.0 - 36.0 g/dL   RDW 65.7 84.6 - 96.2 %   Platelets 185 150 - 400 K/uL    nRBC 0.0 0.0 - 0.2 %    Comment: Performed at [facility], [address], [place], [place] [address]    Imaging / Studies: Dg Abd Acute W/chest  Result Date: 03/18/2019 CLINICAL DATA:  Left inguinal pain EXAM: DG ABDOMEN ACUTE W/ 1V CHEST COMPARISON:  11/13/2016 FINDINGS: There are multiple dilated loops of small bowel, predominantly within the left hemiabdomen. Multiple fluid levels. No free intraperitoneal air. Lungs are clear. IMPRESSION: Small-bowel obstruction. Electronically Signed   By: Deatra Robinson M.D.   On: 03/18/2019 17:39    Medications / Allergies: per chart  Antibiotics: Anti-infectives (From admission, onward)   Start     Dose/Rate Route Frequency Ordered Stop   03/19/19 0200  piperacillin-tazobactam (ZOSYN) IVPB 3.375 g  Status:  Discontinued     3.375 g 12.5 mL/hr over 240 Minutes Intravenous Every 8 hours 03/19/19 0028 03/19/19 0036   03/19/19 0200  piperacillin-tazobactam (ZOSYN) IVPB 3.375 g     3.375 g 12.5 mL/hr over 240 Minutes Intravenous Every 8 hours 03/19/19 0036 03/24/19 0159   03/18/19 2038  clindamycin (CLEOCIN) 900 mg, gentamicin (GARAMYCIN) 240 mg in sodium chloride 0.9 % 1,000 mL for intraperitoneal lavage  Status:  Discontinued       As needed 03/18/19 2038 03/18/19 2253   03/18/19 1845  ceFAZolin (ANCEF) IVPB 2g/100 mL premix     2 g 200 mL/hr over 30 Minutes Intravenous On call to O.R. 03/18/19 1747 03/18/19 1953   03/18/19 1845  metroNIDAZOLE (FLAGYL) IVPB 500 mg     500 mg 100 mL/hr over 60 Minutes Intravenous On call to O.R. 03/18/19 1747 03/18/19 1953   03/18/19 1830  clindamycin (CLEOCIN) 900 mg, gentamicin (GARAMYCIN) 240 mg in sodium chloride 0.9 % 1,000 mL  for intraperitoneal lavage  Status:  Discontinued    Note to Pharmacy:  Have in the  OR room for final irrigation in bowel surgery case to minimize risk of abscess/infection Pharmacy may adjust dosing strength, schedule, rate of infusion, etc as needed to optimize  therapy    Irrigation To Surgery 03/18/19 1828 03/19/19 0002   03/18/19 1806  metroNIDAZOLE (FLAGYL) 5-0.79 MG/ML-% IVPB    Note to Pharmacy:  [name]   : cabinet override      03/18/19 1806 03/18/19 1923   03/18/19 1806  ceFAZolin (ANCEF) 2-4 GM/100ML-% IVPB    Note to Pharmacy:  [name]   : cabinet override      03/18/19 1806 03/18/19 1923        Note: Portions of this report may have been transcribed using voice recognition software. Every effort was made to ensure accuracy; however, inadvertent computerized transcription errors may be present.   Any transcriptional errors that result from this process are unintentional.     Ardeth Sportsman, MD, FACS, MASCRS Gastrointestinal and Minimally Invasive Surgery    [address] [place], [place] [address] [phone] Main / Paging [phone] Fax

## 2019-03-19 NOTE — Progress Notes (Addendum)
CRITICAL VALUE ALERT  Critical Value:  Troponin 0.03  Date & Time Notied: 03/19/2019 0023 am  Provider Notified: Dr. Michaell Cowing  Orders Received/Actions taken: Dr. Michaell Cowing orders a repeat Troponin level for morning labs.

## 2019-03-19 NOTE — Progress Notes (Addendum)
Patient MEWS score 3 with vital signs as follows BP 98/85 , HR 114 , RR 15, Oral temperature 97.8 . Patient was assessed  did not complain of any pain or discomfort, patient was given PRN bolus of LR .  We will continue to monitor.

## 2019-03-19 NOTE — Progress Notes (Signed)
Dr. Johney Maine aware on rounds night RN attempted to flush NGT during shift w/ both air and water yet resistance met each time. MD in to see pt.

## 2019-03-19 NOTE — Progress Notes (Signed)
Pharmacy Antibiotic Note  Omar Bell is a 68 y.o. male admitted on 03/18/2019 withgangrenous SB w perf. IV ABx x 5 days  .  Pharmacy has been consulted for zosyn dosing.  Plan: Zosyn 3.375g IV q8h (4 hour infusion).  X 5 days as per MD request Pharmacy to sign off  Height: 5\' 9"  (175.3 cm) Weight: 160 lb (72.6 kg) IBW/kg (Calculated) : 70.7  Temp (24hrs), Avg:97.8 F (36.6 C), Min:97.5 F (36.4 C), Max:98.1 F (36.7 C)  Recent Labs  Lab 03/18/19 1601  WBC 19.5*  CREATININE 1.17    Estimated Creatinine Clearance: 59.6 mL/min (by C-G formula based on SCr of 1.17 mg/dL).    Allergies  Allergen Reactions  . Pork-Derived Products Nausea And Vomiting    Antimicrobials this admission: 4/10 Cefazolin 2 gm x1 4/10 flagyl 500 x 1 4/11 ZEI >>>    (4/15) Dose adjustments this admission:  Microbiology results:  Thank you for allowing pharmacy to be a part of this patient's care.  Herby Abraham, Pharm.D [phone] 03/19/2019 12:37 AM

## 2019-03-20 ENCOUNTER — Inpatient Hospital Stay (HOSPITAL_COMMUNITY): Payer: 59

## 2019-03-20 DIAGNOSIS — N179 Acute kidney failure, unspecified: Secondary | ICD-10-CM

## 2019-03-20 LAB — CBC
HCT: 37.7 % — ABNORMAL LOW (ref 39.0–52.0)
Hemoglobin: 12.2 g/dL — ABNORMAL LOW (ref 13.0–17.0)
MCH: 30.7 pg (ref 26.0–34.0)
MCHC: 32.4 g/dL (ref 30.0–36.0)
MCV: 94.7 fL (ref 80.0–100.0)
Platelets: 173 10*3/uL (ref 150–400)
RBC: 3.98 MIL/uL — ABNORMAL LOW (ref 4.22–5.81)
RDW: 13.4 % (ref 11.5–15.5)
WBC: 7.8 10*3/uL (ref 4.0–10.5)
nRBC: 0 % (ref 0.0–0.2)

## 2019-03-20 LAB — BASIC METABOLIC PANEL
Anion gap: 13 (ref 5–15)
BUN: 26 mg/dL — ABNORMAL HIGH (ref 8–23)
CO2: 24 mmol/L (ref 22–32)
Calcium: 6.7 mg/dL — ABNORMAL LOW (ref 8.9–10.3)
Chloride: 105 mmol/L (ref 98–111)
Creatinine, Ser: 1.39 mg/dL — ABNORMAL HIGH (ref 0.61–1.24)
GFR calc Af Amer: 60 mL/min — ABNORMAL LOW (ref >60)
GFR calc non Af Amer: 51 mL/min — ABNORMAL LOW (ref >60)
Glucose, Bld: 107 mg/dL — ABNORMAL HIGH (ref 70–99)
Potassium: 3.1 mmol/L — ABNORMAL LOW (ref 3.5–5.1)
Sodium: 142 mmol/L (ref 135–145)

## 2019-03-20 LAB — COMPREHENSIVE METABOLIC PANEL
ALT: 33 U/L (ref 0–44)
AST: 29 U/L (ref 15–41)
Albumin: 4.6 g/dL (ref 3.5–5.0)
Alkaline Phosphatase: 65 U/L (ref 38–126)
Anion gap: 15 (ref 5–15)
BUN: 27 mg/dL — ABNORMAL HIGH (ref 8–23)
CO2: 32 mmol/L (ref 22–32)
Calcium: 9.2 mg/dL (ref 8.9–10.3)
Chloride: 93 mmol/L — ABNORMAL LOW (ref 98–111)
Creatinine, Ser: 1.17 mg/dL (ref 0.61–1.24)
GFR calc Af Amer: >60 mL/min (ref >60)
GFR calc non Af Amer: >60 mL/min (ref >60)
Glucose, Bld: 169 mg/dL — ABNORMAL HIGH (ref 70–99)
Potassium: 2.4 mmol/L — CL (ref 3.5–5.1)
Sodium: 140 mmol/L (ref 135–145)
Total Bilirubin: 2.3 mg/dL — ABNORMAL HIGH (ref 0.3–1.2)
Total Protein: 9.1 g/dL — ABNORMAL HIGH (ref 6.5–8.1)

## 2019-03-20 LAB — MAGNESIUM: Magnesium: 2.4 mg/dL (ref 1.7–2.4)

## 2019-03-20 MED ORDER — METOPROLOL TARTRATE 5 MG/5ML IV SOLN
2.5000 mg | Freq: Three times a day (TID) | INTRAVENOUS | Status: DC
  Administered 2019-03-20 – 2019-04-04 (×42): 2.5 mg via INTRAVENOUS
  Filled 2019-03-20 (×45): qty 5

## 2019-03-20 MED ORDER — METOCLOPRAMIDE HCL 5 MG/ML IJ SOLN
5.0000 mg | Freq: Three times a day (TID) | INTRAMUSCULAR | Status: DC
  Administered 2019-03-20 – 2019-03-21 (×3): 10 mg via INTRAVENOUS
  Filled 2019-03-20 (×3): qty 2

## 2019-03-20 MED ORDER — LACTATED RINGERS IV BOLUS
1000.0000 mL | Freq: Once | INTRAVENOUS | Status: AC
  Administered 2019-03-20: 1000 mL via INTRAVENOUS

## 2019-03-20 MED ORDER — POTASSIUM CHLORIDE 10 MEQ/100ML IV SOLN
10.0000 meq | INTRAVENOUS | Status: AC
  Administered 2019-03-20 (×3): 10 meq via INTRAVENOUS
  Filled 2019-03-20 (×4): qty 100

## 2019-03-20 MED ORDER — DEXTROSE IN LACTATED RINGERS 5 % IV SOLN
INTRAVENOUS | Status: DC
  Administered 2019-03-20 – 2019-03-21 (×2): via INTRAVENOUS

## 2019-03-20 NOTE — Plan of Care (Signed)
Patient lying in bed this morning; minimal pain and no nausea currently. Ambulated in hall with FWW yesterday during day shift. BM yesterday, but no flatus. Will continue to monitor.

## 2019-03-20 NOTE — Progress Notes (Signed)
Omar Bell and Omar Bell arrived to floor from ED and present to patient room. Evaluation completed showing abdominal distention, rapid, shallow respirations and shortness of breath, and hypoactive bowel sounds. Discussion with patient regarding the need for NG tube placement. Patient finally agreeable to allowing them to place NG tube. NG tube placed with no issue in right nare; immediate drainage of 250 ml green drainage. Drainage appears thick with sediment and frequently slows down due to thickness. NG tube flushed for patency and does drain. NG tube marked and taped in place. Placement verified with stethoscope. KUB order placed for placement confirmation.

## 2019-03-20 NOTE — Progress Notes (Signed)
Received phone call from Dr. Michaell Cowing with concerns of patient's ongoing tachycardia and lack of bowel movement after removal of NG tube. Dr. Michaell Cowing would like NG tube placed to decompress patient's stomach. Informed patient that MD would like to have NG tube placed. Patient reluctant to replace and wishes to "wait until morning to see how he feels". Had frank discussion with patient regarding MD concerns for risks of not replacing NG tube and discussed symptoms of tight, distended abdomen and shortness of breath. Patient informed that placing NG tube would ease his discomfort as well as protect sutures from surgery. Patient continued to be reluctant to allow NG placement. Conversation witnessed by [place], [place] who was in the room as well. Returned to desk and called [facility] to see if they would come evaluate the patient and attempt to educate on importance of NG tube placement. Will continue to monitor.

## 2019-03-20 NOTE — Progress Notes (Signed)
Dr. Michaell Cowing notified of pts red MEWs status. Discussed pts improving respiratory status since NG placement. Will continue to monitor.

## 2019-03-20 NOTE — Progress Notes (Signed)
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 Active Problems:   Perforation of small intestine s/p SB resection 03/19/2019   Personal history of noncompliance with medical treatment   Abdominal pain   Strangulated recurrent left inguinal hernia s/p lap repair 03/19/2019   Right inguinal hernia s/p lap repair 03/19/2019   Asthma   Nausea & vomiting   Hypokalemia   SBO (small bowel obstruction) from hernia   2 Days Post-Op  03/18/2019  POST-OPERATIVE DIAGNOSIS:   -Recurrent left inguinal hernia incarcerated with gangrenous perforated jejunum causing small bowel obstruction -Right indirect inguinal hernia -Right femoral hernia  PROCEDURE:  Laparoscopic lysis of adhesions. Small bowel resection Laparoscopic bilateral inguinal and right femoral hernia repairs with absorbable Phasix mesh (TAPP approach) TAP block  SURGEON:  Ardeth Sportsman, MD  Assessment  Guarded  [facility] Stay = 2 days)  Plan:  IV fluid bolus.  He is positive for liters and I's and O's but comes in very dehydrated, hypokalemic, third spacing.keep Foley for now given limited mobility and increasing creatinine.  Most likely can remove when tachycardia less and ileus resolving.  Strict n.p.o. except for ice chips.  Replace NG tube if feeling worse or not better.  He would like to hold off.  We will do some scheduled Reglan to see if I can help.  Replace if he has worsening  nausea or vomiting.  I would use a larger NG tube next time around.  18 [place].  Help keep it from kinking.  -mobilize as tolerated to help recovery.  He did ambulate in the hallways a little bit.  Most likely will focus on chair given tachycardia and mild shock.  Check orthostatics.  Solitary mildly elevated troponin most likely cardiac strain and no evidence of myocardial infarction.  Repeat normal this morning, reassuring.  No chest pain.  No cardiac history.  Repeat EKG if worsening or concerning.  -Hypokalemia.  Replace and follow.  Recheck Magnesium  -IV antibiotics for 5 days postop given gangrene and perforation.  Zosyn 4/10-.  -Rising Cr reflecting AKI from dehydration.  Nonoliguric   -History of asthma.  Albuterol inhaler as needed.  -Low-dose metoprolol & PRN for tachycardia given no strong evidence of shock and I's and O's okay.  Follow closely.  -VTE prophylaxis- SCDs, etc. we will do Lovenox.  Low threshold to hold if unstable or rapidly dropping hemoglobin.  -Continue drain in preperitoneal space to help decompress the region and avoid large hematoma which is a significant risk in this situation.    35 minutes spent in review, evaluation, examination, counseling, and coordination of care.  More than 50% of that time was spent in counseling.  03/20/2019    Subjective: (Chief complaint)  Walked in hallways.  Tired.  Nursing in room.  Some nausea controlled with IV medication.  Sore but not worse.  Bowel movement recorded but patient and nursing cannot recall this.  Objective:  Vital signs:  Vitals:   03/19/19 1738 03/19/19 2206 03/20/19 0217 03/20/19 0507  BP: 98/66 101/72 118/64 120/74  Pulse: (!) 120 (!) 125 (!) 128 (!) 129  Resp:  Temp:  99.9 F (37.7 C) 98.2 F (36.8 C) 99.8 F (37.7 C)  TempSrc:  Oral Oral Oral  SpO2:  90% 90% 92%  Weight:      Height:        Last BM Date: 03/16/19  Intake/Output   Yesterday:  04/11 0701 -  04/12 0700 In: 4268.7 [P.O.:110; I.V.:1823.1; IV Piggyback:2335.6] Out: 1040 [Urine:900; Drains:140] This shift:  No intake/output data recorded.  Bowel function:  Flatus: No  BM:  No  Drain: Serosanguinous (in preperitoneal space suprapubically)   Physical Exam:  General: Pt awake/alert/oriented x4 in mild acute distress Eyes: PERRL, normal EOM.  Sclera clear.  No icterus Neuro: CN II-XII intact w/o focal sensory/motor deficits. Lymph: No head/neck/groin lymphadenopathy Psych:  No delerium/psychosis/paranoia HENT: Normocephalic, Mucus membranes moist.  No thrush Neck: Supple, No tracheal deviation Chest: No chest wall pain w good excursion CV:  Pulses intact.  Regular rhythm MS: Normal AROM mjr joints.  No obvious deformity  Abdomen: Soft.  Moderately distended.  Mildly tender at incisions only.  Dressings clean dry and intact no evidence of peritonitis.  No incarcerated hernias.  GU: Edema of left greater than right groins and upper scrotum.  Actually improved from preop.  Foley catheter in place.  Dark yellow/light tan urine  Ext:  No deformity.  No mjr edema.  No cyanosis Skin: No petechiae / purpura  Results:   Labs: Results for orders placed or performed during the hospital encounter of 03/18/19 (from the past 48 hour(s))  Lipase, blood     Status: None   Collection Time: 03/18/19  4:01 PM  Result Value Ref Range   Lipase 24 11 - 51 U/L    Comment: Performed at [facility], [address] [place], [place] [address]  Comprehensive metabolic panel     Status: Abnormal   Collection Time: 03/18/19  4:01 PM  Result Value Ref Range   Sodium 140 135 - 145 mmol/L   Potassium 2.4 (LL) 3.5 - 5.1 mmol/L    Comment: S.BINGHAM,RN [id number]  BY V.WILKINS   Chloride 93 (L) 98 - 111 mmol/L   CO2 32 22 - 32 mmol/L   Glucose, Bld 169 (H) 70 - 99 mg/dL   BUN 27 (H) 8 - 23 mg/dL   Creatinine, Ser 9.81 0.61 - 1.24 mg/dL   Calcium 9.2 8.9 - 19.1 mg/dL    Total Protein 9.1 (H) 6.5 - 8.1 g/dL   Albumin 4.6 3.5 - 5.0 g/dL   AST 29 15 - 41 U/L   ALT 33 0 - 44 U/L   Alkaline Phosphatase 65 38 - 126 U/L   Total Bilirubin 2.3 (H) 0.3 - 1.2 mg/dL   GFR calc non Af Amer >60 >60 mL/min   GFR calc Af Amer >60 >60 mL/min   Anion gap 15 5 - 15    Comment: Performed at [facility], [address] [place], [place] [address]  CBC     Status: Abnormal   Collection Time: 03/18/19  4:01 PM  Result Value Ref Range   WBC 19.5 (H) 4.0 - 10.5 K/uL   RBC 5.55 4.22 - 5.81 MIL/uL   Hemoglobin 17.3 (H) 13.0 - 17.0 g/dL   HCT 56.2 13.0 - 86.5 %   MCV 90.6 80.0 - 100.0 fL   MCH 31.2 26.0 - 34.0 pg  MCHC 34.4 30.0 - 36.0 g/dL   RDW 95.2 84.1 - 32.4 %   Platelets 235 150 - 400 K/uL   nRBC 0.0 0.0 - 0.2 %    Comment: Performed at [facility], [address], [place], [place] [address]  Prealbumin     Status: None   Collection Time: 03/18/19  4:01 PM  Result Value Ref Range   Prealbumin 19.2 18 - 38 mg/dL    Comment: Performed at [facility], [address] [place], [place] [address]  Magnesium     Status: Abnormal   Collection Time: 03/18/19 11:18 PM  Result Value Ref Range   Magnesium 2.7 (H) 1.7 - 2.4 mg/dL    Comment: Performed at [facility], [address] [place], [place] [address]  Troponin I - Once     Status: Abnormal   Collection Time: 03/18/19 11:18 PM  Result Value Ref Range   Troponin I 0.03 (HH) <0.03 ng/mL    Comment: CRITICAL RESULT CALLED TO, READ BACK BY AND VERIFIED WITH: Otis Brace RN 0021 03/19/2019 HILL K Performed at [facility], [address], [place], [place] [address]   Troponin I - Tomorrow AM 0500     Status: None   Collection Time: 03/19/19  2:46 AM  Result Value Ref Range   Troponin I <0.03 <0.03 ng/mL    Comment: Performed at [facility], [address] [place], [place] [address]  CBC     Status:  Abnormal   Collection Time: 03/19/19  7:59 AM  Result Value Ref Range   WBC 2.7 (L) 4.0 - 10.5 K/uL   RBC 4.80 4.22 - 5.81 MIL/uL   Hemoglobin 14.8 13.0 - 17.0 g/dL   HCT 75.6 43.3 - 29.5 %   MCV 92.7 80.0 - 100.0 fL   MCH 30.8 26.0 - 34.0 pg   MCHC 33.3 30.0 - 36.0 g/dL   RDW 18.8 41.6 - 60.6 %   Platelets 185 150 - 400 K/uL   nRBC 0.0 0.0 - 0.2 %    Comment: Performed at [facility], [address], [place], [place] [address]  Basic metabolic panel     Status: Abnormal   Collection Time: 03/19/19  8:50 AM  Result Value Ref Range   Sodium 140 135 - 145 mmol/L   Potassium 2.4 (LL) 3.5 - 5.1 mmol/L    Comment: CRITICAL RESULT CALLED TO, READ BACK BY AND VERIFIED WITH: ARMSTRONG,P RN [id number] [place],N    Chloride 105 98 - 111 mmol/L   CO2 25 22 - 32 mmol/L   Glucose, Bld 122 (H) 70 - 99 mg/dL   BUN 25 (H) 8 - 23 mg/dL   Creatinine, Ser 2.20 0.61 - 1.24 mg/dL   Calcium 6.8 (L) 8.9 - 10.3 mg/dL    Comment: DELTA CHECK NOTED   GFR calc non Af Amer 59 (L) >60 mL/min   GFR calc Af Amer >60 >60 mL/min   Anion gap 10 5 - 15    Comment: Performed at [facility], [address], [place], [place] [address]  Magnesium     Status: None   Collection Time: 03/19/19  8:50 AM  Result Value Ref Range   Magnesium 2.2 1.7 - 2.4 mg/dL    Comment: Performed at [facility], [address], [place], [place] [address]  CBC     Status: Abnormal   Collection Time: 03/20/19  4:07 AM  Result Value Ref Range   WBC 7.8 4.0 - 10.5 K/uL   RBC 3.98 (L) 4.22 - 5.81 MIL/uL   Hemoglobin 12.2 (L) 13.0 - 17.0 g/dL   HCT 16.1 (L) 09.6 - 04.5 %   MCV 94.7 80.0 - 100.0 fL   MCH 30.7 26.0 - 34.0 pg   MCHC 32.4 30.0 - 36.0 g/dL   RDW 40.9 81.1 - 91.4 %   Platelets 173 150 - 400 K/uL   nRBC 0.0 0.0 - 0.2 %    Comment: Performed at [facility], [address] [place], [place] [address]  Basic metabolic panel     Status:  Abnormal   Collection Time: 03/20/19  4:07 AM  Result Value Ref Range   Sodium 142 135 - 145 mmol/L   Potassium 3.1 (L) 3.5 - 5.1 mmol/L    Comment: REPEATED TO VERIFY DELTA CHECK NOTED NO VISIBLE HEMOLYSIS    Chloride 105 98 - 111 mmol/L   CO2 24 22 - 32 mmol/L   Glucose, Bld 107 (H) 70 - 99 mg/dL   BUN 26 (H) 8 - 23 mg/dL   Creatinine, Ser 6.21 (H) 0.61 - 1.24 mg/dL   Calcium 6.7 (L) 8.9 - 10.3 mg/dL   GFR calc non Af Amer 51 (L) >60 mL/min   GFR calc Af Amer 60 (L) >60 mL/min   Anion gap 13 5 - 15    Comment: Performed at [facility], [address], [place], [place] [address]    Imaging / Studies: Dg Abd Acute W/chest  Result Date: 03/18/2019 CLINICAL DATA:  Left inguinal pain EXAM: DG ABDOMEN ACUTE W/ 1V CHEST COMPARISON:  11/13/2016 FINDINGS: There are multiple dilated loops of small bowel, predominantly within the left hemiabdomen. Multiple fluid levels. No free intraperitoneal air. Lungs are clear. IMPRESSION: Small-bowel obstruction. Electronically Signed   By: Deatra Robinson M.D.   On: 03/18/2019 17:39    Medications / Allergies: per chart  Antibiotics: Anti-infectives (From admission, onward)   Start     Dose/Rate Route Frequency Ordered Stop   03/19/19 0200  piperacillin-tazobactam (ZOSYN) IVPB 3.375 g  Status:  Discontinued     3.375 g 12.5 mL/hr over 240 Minutes Intravenous Every 8 hours 03/19/19 0028 03/19/19 0036   03/19/19 0200  piperacillin-tazobactam (ZOSYN) IVPB 3.375 g     3.375 g 12.5 mL/hr over 240 Minutes Intravenous Every 8 hours 03/19/19 0036 03/24/19 0159   03/18/19 2038  clindamycin (CLEOCIN) 900 mg, gentamicin (GARAMYCIN) 240 mg in sodium chloride 0.9 % 1,000 mL for intraperitoneal lavage  Status:  Discontinued       As needed 03/18/19 2038 03/18/19 2253   03/18/19 1845  ceFAZolin (ANCEF) IVPB 2g/100 mL premix     2 g 200 mL/hr over 30 Minutes Intravenous On call to O.R. 03/18/19 1747 03/18/19 1953   03/18/19 1845   metroNIDAZOLE (FLAGYL) IVPB 500 mg     500 mg 100 mL/hr over 60 Minutes Intravenous On call to O.R. 03/18/19 1747 03/18/19 1953   03/18/19 1830  clindamycin (CLEOCIN) 900 mg, gentamicin (GARAMYCIN) 240 mg in sodium chloride 0.9 % 1,000 mL for intraperitoneal lavage  Status:  Discontinued    Note to Pharmacy:  Have in the  OR room for final irrigation in bowel surgery case to minimize risk of abscess/infection Pharmacy may adjust dosing strength, schedule, rate of infusion, etc as needed to optimize therapy    Irrigation To Surgery 03/18/19 1828 03/19/19 0002  03/18/19 1806  metroNIDAZOLE (FLAGYL) 5-0.79 MG/ML-% IVPB    Note to Pharmacy:  Marney DoctorMentley, Pamela   : cabinet override      03/18/19 1806 03/18/19 1923   03/18/19 1806  ceFAZolin (ANCEF) 2-4 GM/100ML-% IVPB    Note to Pharmacy:  Marney DoctorMentley, Pamela   : cabinet override      03/18/19 1806 03/18/19 1923        Note: Portions of this report may have been transcribed using voice recognition software. Every effort was made to ensure accuracy; however, inadvertent computerized transcription errors may be present.   Any transcriptional errors that result from this process are unintentional.     Ardeth SportsmanSteven C. [place] Rattigan, MD, FACS, MASCRS Gastrointestinal and Minimally Invasive Surgery    [address] [place], [place] [id number] [phone] Main / Paging [phone] Fax

## 2019-03-21 ENCOUNTER — Encounter (HOSPITAL_COMMUNITY): Payer: Self-pay | Admitting: Surgery

## 2019-03-21 ENCOUNTER — Inpatient Hospital Stay (HOSPITAL_COMMUNITY): Payer: 59

## 2019-03-21 DIAGNOSIS — K4041 Unilateral inguinal hernia, with gangrene, recurrent: Principal | ICD-10-CM

## 2019-03-21 DIAGNOSIS — R112 Nausea with vomiting, unspecified: Secondary | ICD-10-CM

## 2019-03-21 DIAGNOSIS — J45909 Unspecified asthma, uncomplicated: Secondary | ICD-10-CM

## 2019-03-21 DIAGNOSIS — N179 Acute kidney failure, unspecified: Secondary | ICD-10-CM

## 2019-03-21 LAB — CBC
HCT: 33.6 % — ABNORMAL LOW (ref 39.0–52.0)
Hemoglobin: 10.8 g/dL — ABNORMAL LOW (ref 13.0–17.0)
MCH: 30.7 pg (ref 26.0–34.0)
MCHC: 32.1 g/dL (ref 30.0–36.0)
MCV: 95.5 fL (ref 80.0–100.0)
Platelets: 154 10*3/uL (ref 150–400)
RBC: 3.52 MIL/uL — ABNORMAL LOW (ref 4.22–5.81)
RDW: 13.9 % (ref 11.5–15.5)
WBC: 8.6 10*3/uL (ref 4.0–10.5)
nRBC: 0 % (ref 0.0–0.2)

## 2019-03-21 LAB — AMMONIA: Ammonia: 21 umol/L (ref 9–35)

## 2019-03-21 LAB — BRAIN NATRIURETIC PEPTIDE: B Natriuretic Peptide: 185.1 pg/mL — ABNORMAL HIGH (ref 0.0–100.0)

## 2019-03-21 LAB — BASIC METABOLIC PANEL
Anion gap: 10 (ref 5–15)
BUN: 22 mg/dL (ref 8–23)
CO2: 23 mmol/L (ref 22–32)
Calcium: 7.2 mg/dL — ABNORMAL LOW (ref 8.9–10.3)
Chloride: 107 mmol/L (ref 98–111)
Creatinine, Ser: 1.05 mg/dL (ref 0.61–1.24)
GFR calc Af Amer: >60 mL/min (ref >60)
GFR calc non Af Amer: >60 mL/min (ref >60)
Glucose, Bld: 113 mg/dL — ABNORMAL HIGH (ref 70–99)
Potassium: 3.3 mmol/L — ABNORMAL LOW (ref 3.5–5.1)
Sodium: 140 mmol/L (ref 135–145)

## 2019-03-21 MED ORDER — ACETAMINOPHEN 10 MG/ML IV SOLN
1000.0000 mg | Freq: Four times a day (QID) | INTRAVENOUS | Status: AC
  Administered 2019-03-21 – 2019-03-22 (×3): 1000 mg via INTRAVENOUS
  Filled 2019-03-21 (×5): qty 100

## 2019-03-21 MED ORDER — LACTATED RINGERS IV BOLUS: 1000.0000 mL | Freq: Three times a day (TID) | INTRAVENOUS | Status: DC | PRN

## 2019-03-21 MED ORDER — KCL IN DEXTROSE-NACL 20-5-0.45 MEQ/L-%-% IV SOLN
INTRAVENOUS | Status: AC
  Administered 2019-03-21 – 2019-03-24 (×6): via INTRAVENOUS
  Filled 2019-03-21 (×6): qty 1000

## 2019-03-21 MED ORDER — POTASSIUM CHLORIDE 10 MEQ/100ML IV SOLN: 10.0000 meq | INTRAVENOUS | Status: DC

## 2019-03-21 MED ORDER — POTASSIUM CHLORIDE 10 MEQ/100ML IV SOLN
10.0000 meq | INTRAVENOUS | Status: AC
  Administered 2019-03-21 (×4): 10 meq via INTRAVENOUS
  Filled 2019-03-21 (×4): qty 100

## 2019-03-21 NOTE — Progress Notes (Signed)
Received a call from nursing around 1400 that patient is refusing both his head ct and his CT of his chest.  The patient was told that this was necessary to rule out certain etiologies that we were concerned about, but he is still refusing at this time.  Will continue to monitor and have nursing continue to encourage patient at our request.  Letha Cape 3:13 PM 03/21/2019

## 2019-03-21 NOTE — Anesthesia Postprocedure Evaluation (Signed)
Anesthesia Post Note  Patient: Omar Bell  Procedure(s) Performed: DIAGNOSTIC LAPAROSCOPY, SMALL BOWEL RESECTION, REPAIR OF LEFT STRANGULATED RECURRENT INGUINAL HERNIA WITH GANGRENE; RIGHT INGUINAL HERNIA REPAIR (Bilateral )     Patient location during evaluation: Other Anesthesia Type: General Level of consciousness: awake and alert Pain management: pain level controlled Vital Signs Assessment: post-procedure vital signs reviewed and stable Respiratory status: spontaneous breathing, nonlabored ventilation, respiratory function stable and patient connected to nasal cannula oxygen Cardiovascular status: blood pressure returned to baseline and stable Postop Assessment: no apparent nausea or vomiting Anesthetic complications: no    Last Vitals:  Vitals:   03/21/19 0245 03/21/19 0634  BP: 117/69 115/73  Pulse: (!) 116 (!) 109  Resp: (!) 28 (!) 27  Temp: 36.7 C (!) 36.3 C  SpO2: 94% 97%    Last Pain:  Vitals:   03/21/19 0634  TempSrc: Oral  PainSc:                  Corlene Sabia,W. EDMOND

## 2019-03-21 NOTE — Consult Note (Signed)
Medical Consultation   Omar Bell  AUQ:[id number]  DOB: [birth year]  DOA: 03/18/2019  PCP: Benita Stabile, MD    Requesting physician: Surgery   Reason for consultation: Altered mental status, tachycardia and tachypnea.   History of Present Illness: Omar Bell is an 68 y.o. male with prior h/o hyperlipidemia, initially presented to ED with incarcerated left inguinal hernia perforation of the jejunum causing SBO, underwent laparoscopic lysis of adhesions, small bowel resection, lap bil inguinal and right femoral hernia repairs with absorbable mesh, by surgery on 03/18/2019.  Today patient is lethargic, confused, tachycardic, tachypneic with's shortness of breath and medical service consulted for evaluation of the above.   Review of Systems:  PT reports some nausea, and vomiting earlier this am.  No fever or chills.  No cough or chest pain or sob. He reports tachypnea.  Abdominal pain is the same as yesterday.  No headache, dizziness or lightheadedness.  No dysuria,  No tingling or numbness of the extremities.      Past Medical History: Past Medical History:  Diagnosis Date  . Hyperlipidemia     Past Surgical History: Past Surgical History:  Procedure Laterality Date  . INGUINAL HERNIA REPAIR Left 1991   open repair.  [place]  . INGUINAL HERNIA REPAIR Bilateral 03/18/2019   Procedure: DIAGNOSTIC LAPAROSCOPY, SMALL BOWEL RESECTION, REPAIR OF LEFT STRANGULATED RECURRENT INGUINAL HERNIA WITH GANGRENE; RIGHT INGUINAL HERNIA REPAIR;  Surgeon: Karie Soda, MD;  Location: [facility];  Service: General;  Laterality: Bilateral;  . LAPAROSCOPIC INGUINAL HERNIA REPAIR Bilateral 03/18/2019   Phasix mesh  . SMALL INTESTINE SURGERY  03/18/2019   Gangrenous perforated SB strangulated in recurrent left inguinal hernia  . TONSILLECTOMY       Allergies:   Allergies  Allergen Reactions  . Pork-Derived Products Nausea And Vomiting     Social History:  reports  that he has never smoked. He has never used smokeless tobacco. He reports that he does not drink alcohol or use drugs.   Family History: Family History  Problem Relation Age of Onset  . Cancer Mother   . Colon cancer Neg Hx     : Family history reviewed and pertinent.   Physical Exam: Vitals:   03/21/19 0149 03/21/19 0245 03/21/19 0634 03/21/19 1004  BP: 122/71 117/69 115/73 120/74  Pulse: (!) 115 (!) 116 (!) 109 (!) 102  Resp: (!) 28 (!) 28 (!) 27 18  Temp: 97.8 F (36.6 C) 98.1 F (36.7 C) (!) 97.4 F (36.3 C) 99 F (37.2 C)  TempSrc: Oral Oral Oral Oral  SpO2: 96% 94% 97% 97%  Weight:      Height:        Constitutional: alert, and oriented to place and person, and to the year, not in any acute distress Eyes: PERLA, EOMI, irises appear normal, ENMT: Lips appears normal, oropharynx , ng tube in place.  Neck: neck appears normal, no masses, normal ROM, no thyromegaly, no JVD  CVS: S1-S2 clear, no murmur rubs or gallops, no LE edema, normal pedal pulses  Respiratory: Basilar crackles and diminished air entry at bases. Abdomen: soft , mild tender generalized, mild abdominal distention, lap incisions bandaged. Pelvic drain in place with sero sanguinous fluid.   Musculoskeletal: : no cyanosis, clubbing or edema noted bilaterally                 Neuro: alert, oriented to place and person.  Psych:  Cannot be assessed at this time. Skin: no rashes or lesions or ulcers,    Data reviewed:  I have personally reviewed following labs and imaging studies Labs:  CBC: Recent Labs  Lab 03/18/19 1601 03/19/19 0759 03/20/19 0407 03/21/19 0314  WBC 19.5* 2.7* 7.8 8.6  HGB 17.3* 14.8 12.2* 10.8*  HCT 50.3 44.5 37.7* 33.6*  MCV 90.6 92.7 94.7 95.5  PLT 235 185 173 154    Basic Metabolic Panel: Recent Labs  Lab 03/18/19 1601 03/18/19 2318 03/19/19 0850 03/20/19 0407 03/21/19 0314  NA 140  --  140 142 140  K 2.4*  --  2.4* 3.1* 3.3*  CL 93*  --  105 105 107  CO2 32  --   25 24 23   GLUCOSE 169*  --  122* 107* 113*  BUN 27*  --  25* 26* 22  CREATININE 1.17  --  1.24 1.39* 1.05  CALCIUM 9.2  --  6.8* 6.7* 7.2*  MG  --  2.7* 2.2 2.4  --    GFR Estimated Creatinine Clearance: 66.4 mL/min (by C-G formula based on SCr of 1.05 mg/dL). Liver Function Tests: Recent Labs  Lab 03/18/19 1601  AST 29  ALT 33  ALKPHOS 65  BILITOT 2.3*  PROT 9.1*  ALBUMIN 4.6   Recent Labs  Lab 03/18/19 1601  LIPASE 24   No results for input(s): AMMONIA in the last 168 hours. Coagulation profile No results for input(s): INR, PROTIME in the last 168 hours.  Cardiac Enzymes: Recent Labs  Lab 03/18/19 2318 03/19/19 0246  TROPONINI 0.03* <0.03   BNP: Invalid input(s): POCBNP CBG: No results for input(s): GLUCAP in the last 168 hours. D-Dimer No results for input(s): DDIMER in the last 72 hours. Hgb A1c No results for input(s): HGBA1C in the last 72 hours. Lipid Profile No results for input(s): CHOL, HDL, LDLCALC, TRIG, CHOLHDL, LDLDIRECT in the last 72 hours. Thyroid function studies No results for input(s): TSH, T4TOTAL, T3FREE, THYROIDAB in the last 72 hours.  Invalid input(s): FREET3 Anemia work up No results for input(s): VITAMINB12, FOLATE, FERRITIN, TIBC, IRON, RETICCTPCT in the last 72 hours. Urinalysis No results found for: COLORURINE, APPEARANCEUR, LABSPEC, PHURINE, GLUCOSEU, HGBUR, BILIRUBINUR, KETONESUR, PROTEINUR, UROBILINOGEN, NITRITE, LEUKOCYTESUR   Microbiology No results found for this or any previous visit (from the past 240 hour(s)).     Inpatient Medications:   Scheduled Meds: . enoxaparin (LOVENOX) injection  40 mg Subcutaneous Q24H  . lip balm  1 application Topical BID  . metoprolol tartrate  2.5 mg Intravenous Q8H   Continuous Infusions: . acetaminophen 1,000 mg (03/21/19 1146)  . dextrose 5 % and 0.45 % NaCl with KCl 20 mEq/L 125 mL/hr at 03/21/19 1049  . methocarbamol (ROBAXIN) IV    . ondansetron (ZOFRAN) IV    .  piperacillin-tazobactam (ZOSYN)  IV 3.375 g (03/21/19 0927)  . sodium chloride       Radiological Exams on Admission: Dg Abd 1 View  Result Date: 03/20/2019 CLINICAL DATA:  Check gastric catheter placement EXAM: ABDOMEN - 1 VIEW COMPARISON:  03/18/2019 FINDINGS: Scattered large and small bowel gas is noted. Gastric catheter is noted within the stomach extending into the proximal duodenum. Persistent small bowel dilatation is noted on the left similar to that seen on the prior exam. IMPRESSION: Gastric catheter within the stomach extending into the proximal duodenum. Electronically Signed   By: Alcide CleverMark  Lukens M.D.   On: 03/20/2019 19:31   Dg Abd Portable 1v  Result Date:  03/21/2019 CLINICAL DATA:  Abdominal distension. EXAM: PORTABLE ABDOMEN - 1 VIEW COMPARISON:  March 20, 2019 FINDINGS: The NG tube now terminates in the third portion of the duodenum. Dilated loops of small bowel are seen in the left lateral abdomen. A small amount of gas is seen throughout the colon to the level the rectum. There is a surgical drain in the pelvis. There is air in the right lateral abdominal wall, likely postsurgical. No other acute abnormalities noted. IMPRESSION: 1. The NG tube terminates in the third portion of the duodenum. Consider withdrawing several cm so the distal tip and side port are in the stomach. 2. Dilated loops of small bowel in the left lateral abdomen could represent continued obstruction versus focal ileus. Recommend clinical correlation and attention on follow-up. 3. Air in the right lateral abdominal wall favored to be postsurgical given history. These results will be called to the ordering clinician or representative by the Radiologist Assistant, and communication documented in the PACS or zVision Dashboard. Electronically Signed   By: Gerome Sam III M.D   On: 03/21/2019 11:09    Impression/Recommendations Principal Problem:   Intestinal obstruction with gangrene due to recurrent left  inguinal hernia Active Problems:   Abdominal pain   Strangulated recurrent left inguinal hernia s/p lap repair 03/19/2019   Right inguinal hernia s/p lap repair 03/19/2019   Asthma   Nausea & vomiting   Hypokalemia   Perforation of small intestine s/p SB resection 03/19/2019   SBO (small bowel obstruction) from hernia   Personal history of noncompliance with medical treatment   AKI (acute kidney injury) (HCC)   Dehydration  Acute metabolic encephalopathy Pt alert and oriented to place and person. Unclear etiology. Get ammonia, vit B 12, TSH, . Get CT head without contrast for further evaluation.   Tachycardia and tachypnea, with oxygen sats in low 90's on RA:  CT angio of the chest ordered to rule out PE.   Incarcerated left inguinal hernia with gangrenous perforation of the jejunum and SBO Further management as per surgery.   Acute anemia of blood loss from surgery Transfuse to keep hemoglobin greater than 7. Hemoglobin on admission was 17.3, currently it is around 10.  Acute kidney injury Probably from dehydration and contrast. Continue with gentle hydration.  Hypokalemia Replaced Repeat in the morning.  Right bundle branch block on EKG; unclear if new or old.  Pt denies chest pain. Troponin 2 sets have been negative on admission.  Repeat EKG in am, and get CT angio to rule out PE.  No pedal edema or DOE noted. No indication for echocardiogram at this time.  Get BNP.    Thank you for this consultation.  [facility] The hospitalist team will follow the patient with you.   Time Spent: 45 minutes.   Kathlen Mody M.D. [facility] 03/21/2019, 12:52 PM

## 2019-03-21 NOTE — Care Management Important Message (Signed)
Important Message  Patient Details IM Letter given to Case Manager to present to the Patient Name: Omar Bell MRN: [id number] Date of Birth: October 29, 1951   Medicare Important Message Given:  Yes    Caren Macadam 03/21/2019, 1:11 PM

## 2019-03-21 NOTE — Progress Notes (Signed)
PA notified earlier today of patient refusing CT scan of head and chest. Educated patient on purpose of the scan and why it is used.    Melida Quitter RN, BSN

## 2019-03-21 NOTE — Progress Notes (Addendum)
Patient ID: Lucienne Capersnthony Perkey, male   DOB: [birth year], 68 y.o.   MRN: [id number]    3 Days Post-Op  Subjective: Patient very sleepy upon my arrival.  After talking to him and agitating him a little he woke up some more.  He appears to not have had any significant sedating medications or altering medications except reglan recently.  He denies any flatus.  He thinks it is 1904, but he does know he is in the hospital.  He states he is SOB, but is on 2L of O2 good sats.  ROS: Please see subjective portion for ROS, otherwise all other systems are currently negative.  Objective: Vital signs in last 24 hours: Temp:  [97.4 F (36.3 C)-99.9 F (37.7 C)] 97.4 F (36.3 C) (04/13 0634) Pulse Rate:  [109-128] 109 (04/13 0634) Resp:  [16-32] 27 (04/13 0634) BP: (111-125)/(67-77) 115/73 (04/13 0634) SpO2:  [91 %-97 %] 97 % (04/13 0634) Last BM Date: 03/17/19  Intake/Output from previous day: 04/12 0701 - 04/13 0700 In: 2029.8 [P.O.:60; I.V.:1504.8; IV Piggyback:465] Out: 4220 [Urine:1850; Emesis/NG output:2350; Drains:20] Intake/Output this shift: No intake/output data recorded.  PE: Gen: drowsy, but NAD Heart: tachy, but regular Lungs: CTAB, only pulls 250-500 on IS.  O2 in place with 2L via [place] Abd: soft, but distended, few BS, NGT with over 2L of output since yesterday, bilious in cannister, pfannenstiel incision is c/d/i.  Wicks still present.  JP drain with small amount of serosang output.  All other lap incisions are c/d/i. GU: edematous penis with foley catheter in place, about 1L of urine documented yesterday. Neuro: oriented to person, place.  No to time, drowsy   Lab Results:  Recent Labs    03/20/19 0407 03/21/19 0314  WBC 7.8 8.6  HGB 12.2* 10.8*  HCT 37.7* 33.6*  PLT 173 154   BMET Recent Labs    03/20/19 0407 03/21/19 0314  NA 142 140  K 3.1* 3.3*  CL 105 107  CO2 24 23  GLUCOSE 107* 113*  BUN 26* 22  CREATININE 1.39* 1.05  CALCIUM 6.7* 7.2*   PT/INR No  results for input(s): LABPROT, INR in the last 72 hours. CMP     Component Value Date/Time   NA 140 03/21/2019 0314   K 3.3 (L) 03/21/2019 0314   CL 107 03/21/2019 0314   CO2 23 03/21/2019 0314   GLUCOSE 113 (H) 03/21/2019 0314   BUN 22 03/21/2019 0314   CREATININE 1.05 03/21/2019 0314   CALCIUM 7.2 (L) 03/21/2019 0314   PROT 9.1 (H) 03/18/2019 1601   ALBUMIN 4.6 03/18/2019 1601   AST 29 03/18/2019 1601   ALT 33 03/18/2019 1601   ALKPHOS 65 03/18/2019 1601   BILITOT 2.3 (H) 03/18/2019 1601   GFRNONAA >60 03/21/2019 0314   GFRAA >60 03/21/2019 0314   Lipase     Component Value Date/Time   LIPASE 24 03/18/2019 1601       Studies/Results: Dg Abd 1 View  Result Date: 03/20/2019 CLINICAL DATA:  Check gastric catheter placement EXAM: ABDOMEN - 1 VIEW COMPARISON:  03/18/2019 FINDINGS: Scattered large and small bowel gas is noted. Gastric catheter is noted within the stomach extending into the proximal duodenum. Persistent small bowel dilatation is noted on the left similar to that seen on the prior exam. IMPRESSION: Gastric catheter within the stomach extending into the proximal duodenum. Electronically Signed   By: Alcide CleverMark  Lukens M.D.   On: 03/20/2019 19:31    Anti-infectives: Anti-infectives (From admission, onward)  Start     Dose/Rate Route Frequency Ordered Stop   03/19/19 0200  piperacillin-tazobactam (ZOSYN) IVPB 3.375 g  Status:  Discontinued     3.375 g 12.5 mL/hr over 240 Minutes Intravenous Every 8 hours 03/19/19 0028 03/19/19 0036   03/19/19 0200  piperacillin-tazobactam (ZOSYN) IVPB 3.375 g     3.375 g 12.5 mL/hr over 240 Minutes Intravenous Every 8 hours 03/19/19 0036 03/24/19 0159   03/18/19 2038  clindamycin (CLEOCIN) 900 mg, gentamicin (GARAMYCIN) 240 mg in sodium chloride 0.9 % 1,000 mL for intraperitoneal lavage  Status:  Discontinued       As needed 03/18/19 2038 03/18/19 2253   03/18/19 1845  ceFAZolin (ANCEF) IVPB 2g/100 mL premix     2 g 200 mL/hr over  30 Minutes Intravenous On call to O.R. 03/18/19 1747 03/18/19 1953   03/18/19 1845  metroNIDAZOLE (FLAGYL) IVPB 500 mg     500 mg 100 mL/hr over 60 Minutes Intravenous On call to O.R. 03/18/19 1747 03/18/19 1953   03/18/19 1830  clindamycin (CLEOCIN) 900 mg, gentamicin (GARAMYCIN) 240 mg in sodium chloride 0.9 % 1,000 mL for intraperitoneal lavage  Status:  Discontinued    Note to Pharmacy:  Have in the  OR room for final irrigation in bowel surgery case to minimize risk of abscess/infection Pharmacy may adjust dosing strength, schedule, rate of infusion, etc as needed to optimize therapy    Irrigation To Surgery 03/18/19 1828 03/19/19 0002   03/18/19 1806  metroNIDAZOLE (FLAGYL) 5-0.79 MG/ML-% IVPB    Note to Pharmacy:  [name]   : cabinet override      03/18/19 1806 03/18/19 1923   03/18/19 1806  ceFAZolin (ANCEF) 2-4 GM/100ML-% IVPB    Note to Pharmacy:  [name]   : cabinet override      03/18/19 1806 03/18/19 1923       Assessment/Plan  POD 3, s/p laparoscopic BIH repairs with absorbable Phasix mesh with SBR for 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  Only pulling around 250-500 -dressing removed from pfannenstiel incision.  Dry dressing -cont JP drain for now and follow -PT eval for mobilization  ARF - resolved, Cr 1.05 today.  Check BMET in am given contrast load today with CT Tachy/tachypnea/SOB - ? SIRS type response vs sepsis; however, he has no fever and his WBC is normal.  Cont abx therapy.  Will also obtain a CT angio of chest today to rule out PE as cause for these symptoms.  Will also obtained new EKG as pre-op EKG had some abnormal findings on it and since he has had persistent tachycardia as well as a minimal bump in his troponins  (since normalized) AMS - new post op.  Will check CT head Hypokalemia - replace K today, chest BMET in am  FEN - NPO/NGT/IVFs, replace K VTE - Lovenox, SCDs ID - Zoayn   LOS: 3 days    Letha Cape , [facility] 03/21/2019, 9:55 AM Pager: [phone]

## 2019-03-22 ENCOUNTER — Inpatient Hospital Stay (HOSPITAL_COMMUNITY): Payer: 59

## 2019-03-22 ENCOUNTER — Encounter (HOSPITAL_COMMUNITY): Payer: 59

## 2019-03-22 DIAGNOSIS — R0689 Other abnormalities of breathing: Secondary | ICD-10-CM

## 2019-03-22 DIAGNOSIS — K4090 Unilateral inguinal hernia, without obstruction or gangrene, not specified as recurrent: Secondary | ICD-10-CM

## 2019-03-22 DIAGNOSIS — R1030 Lower abdominal pain, unspecified: Secondary | ICD-10-CM

## 2019-03-22 DIAGNOSIS — Z9119 Patient's noncompliance with other medical treatment and regimen: Secondary | ICD-10-CM

## 2019-03-22 DIAGNOSIS — K56609 Unspecified intestinal obstruction, unspecified as to partial versus complete obstruction: Secondary | ICD-10-CM

## 2019-03-22 DIAGNOSIS — K4030 Unilateral inguinal hernia, with obstruction, without gangrene, not specified as recurrent: Secondary | ICD-10-CM

## 2019-03-22 DIAGNOSIS — E876 Hypokalemia: Secondary | ICD-10-CM

## 2019-03-22 DIAGNOSIS — R0600 Dyspnea, unspecified: Secondary | ICD-10-CM

## 2019-03-22 LAB — BASIC METABOLIC PANEL
Anion gap: 7 (ref 5–15)
BUN: 20 mg/dL (ref 8–23)
CO2: 23 mmol/L (ref 22–32)
Calcium: 7.4 mg/dL — ABNORMAL LOW (ref 8.9–10.3)
Chloride: 107 mmol/L (ref 98–111)
Creatinine, Ser: 0.92 mg/dL (ref 0.61–1.24)
GFR calc Af Amer: >60 mL/min (ref >60)
GFR calc non Af Amer: >60 mL/min (ref >60)
Glucose, Bld: 126 mg/dL — ABNORMAL HIGH (ref 70–99)
Potassium: 3.7 mmol/L (ref 3.5–5.1)
Sodium: 137 mmol/L (ref 135–145)

## 2019-03-22 LAB — ECHOCARDIOGRAM COMPLETE
Height: 69 in
Weight: 2560 oz

## 2019-03-22 LAB — CBC
HCT: 32.7 % — ABNORMAL LOW (ref 39.0–52.0)
Hemoglobin: 10.2 g/dL — ABNORMAL LOW (ref 13.0–17.0)
MCH: 29.9 pg (ref 26.0–34.0)
MCHC: 31.2 g/dL (ref 30.0–36.0)
MCV: 95.9 fL (ref 80.0–100.0)
Platelets: 145 10*3/uL — ABNORMAL LOW (ref 150–400)
RBC: 3.41 MIL/uL — ABNORMAL LOW (ref 4.22–5.81)
RDW: 13.8 % (ref 11.5–15.5)
WBC: 10.5 10*3/uL (ref 4.0–10.5)
nRBC: 0 % (ref 0.0–0.2)

## 2019-03-22 LAB — VITAMIN B12: Vitamin B-12: 728 pg/mL (ref 180–914)

## 2019-03-22 LAB — TSH: TSH: 4.262 u[IU]/mL (ref 0.350–4.500)

## 2019-03-22 MED ORDER — ACETAMINOPHEN 10 MG/ML IV SOLN
1000.0000 mg | Freq: Four times a day (QID) | INTRAVENOUS | Status: DC
  Filled 2019-03-22 (×4): qty 100

## 2019-03-22 MED ORDER — POTASSIUM CHLORIDE 10 MEQ/100ML IV SOLN
10.0000 meq | INTRAVENOUS | Status: AC
  Administered 2019-03-22 – 2019-03-23 (×4): 10 meq via INTRAVENOUS
  Filled 2019-03-22 (×4): qty 100

## 2019-03-22 MED ORDER — DIPHENHYDRAMINE HCL 50 MG/ML IJ SOLN
12.5000 mg | Freq: Every evening | INTRAMUSCULAR | Status: DC | PRN
  Administered 2019-03-22: 03:00:00 12.5 mg via INTRAVENOUS
  Filled 2019-03-22: qty 1

## 2019-03-22 MED ORDER — PERFLUTREN LIPID MICROSPHERE
1.0000 mL | INTRAVENOUS | Status: AC | PRN
  Administered 2019-03-22: 3 mL via INTRAVENOUS

## 2019-03-22 MED ORDER — ZOLPIDEM TARTRATE 5 MG PO TABS
5.0000 mg | ORAL_TABLET | Freq: Every evening | ORAL | Status: DC | PRN
  Administered 2019-03-22 – 2019-03-28 (×5): 5 mg via ORAL
  Filled 2019-03-22 (×5): qty 1

## 2019-03-22 MED ORDER — DM-GUAIFENESIN ER 30-600 MG PO TB12
1.0000 | ORAL_TABLET | Freq: Two times a day (BID) | ORAL | Status: DC
  Administered 2019-03-22 – 2019-03-24 (×5): 1 via ORAL
  Filled 2019-03-22 (×5): qty 1

## 2019-03-22 MED ORDER — FUROSEMIDE 10 MG/ML IJ SOLN
20.0000 mg | Freq: Once | INTRAMUSCULAR | Status: AC
  Administered 2019-03-22: 10:00:00 20 mg via INTRAVENOUS
  Filled 2019-03-22: qty 2

## 2019-03-22 MED ORDER — DM-GUAIFENESIN ER 30-600 MG PO TB12: 1.0000 | ORAL_TABLET | ORAL | Status: DC

## 2019-03-22 NOTE — Evaluation (Signed)
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 Pt currently with functional limitations due to the deficits listed below (see PT Problem List).  Pt will benefit from skilled PT to increase their independence and safety with mobility to allow discharge to the venue listed below.  Pt presents with some decreased safety awareness however had some post op confusion and acute metabolic encephalopathy.  Pt ambulated around unit with a few seated rest breaks and safety cues.  Pt motivated to ambulate.     Follow Up Recommendations Home health PT;Supervision for mobility/OOB    Equipment Recommendations  Rolling walker with 5" wheels    Recommendations for Other Services       Precautions / Restrictions Precautions Precautions: Fall Precaution Comments: NG tube, JP drain      Mobility  Bed Mobility Overal bed mobility: Needs Assistance Bed Mobility: Supine to Sit;Sit to Supine     Supine to sit: Mod assist Sit to supine: Mod assist   General bed mobility comments: verbal cues for log roll technique, assist for trunk upright and LEs onto bed  Transfers Overall transfer level: Needs assistance Equipment used: Rolling walker (2 wheeled) Transfers: Sit to/from Stand Sit to Stand: Min assist         General transfer comment: cues for hand placement. assist to rise and control descent  Ambulation/Gait Ambulation/Gait assistance: Min assist Gait Distance (Feet): 400 Feet Assistive device: Rolling walker (2 wheeled) Gait Pattern/deviations: Step-through pattern;Decreased stride length;Trunk flexed     General  Gait Details: increased trunk flexion with max cues for upright posture and remaining within RW, pt required 3 seated rest breaks,  pt reluctant to return to room without completing one lap around unit, pt starts leaning more and resting his forearms on RW which was stated to be unsafe, pt persistant however PT had pt sit in chair at least for rest breaks as he was determined to continue  Stairs            Wheelchair Mobility    Modified Rankin (Stroke Patients Only)       Balance                                             Pertinent Vitals/Pain Pain Assessment: Faces Faces Pain Scale: Hurts a little bit Pain Location: surgical sites Pain Descriptors / Indicators: Sore;Tender Pain Intervention(s): Monitored during session;Repositioned    Home Living Family/patient expects to be discharged to:: Private residence Living Arrangements: Alone Available Help at Discharge: Neighbor Type of Home: House       Home Layout: Two level;Bed/bath upstairs Home Equipment: None Additional Comments: pt reports he lives alone in an apt however was staying with an elderly person during COVID-19 stay at home order    Prior Function Level of Independence: Independent         Comments: reports performing a lot of yardwork prior to admission     Hand Dominance        Extremity/Trunk Assessment        Lower  Extremity Assessment Lower Extremity Assessment: Generalized weakness       Communication   Communication: No difficulties  Cognition Arousal/Alertness: Awake/alert Behavior During Therapy: Flat affect Overall Cognitive Status: No family/caregiver present to determine baseline cognitive functioning                                 General Comments: pt appropriate however presents with cognitive deficits, requires cues and has poor safety awareness      General Comments      Exercises     Assessment/Plan    PT Assessment Patient  needs continued PT services  PT Problem List Decreased strength;Decreased mobility;Decreased activity tolerance;Decreased balance;Decreased knowledge of use of DME;Decreased cognition;Decreased safety awareness       PT Treatment Interventions DME instruction;Functional mobility training;Gait training;Balance training;Therapeutic activities;Patient/family education;Stair training;Therapeutic exercise    PT Goals (Current goals can be found in the Care Plan section)  Acute Rehab PT Goals PT Goal Formulation: With patient Time For Goal Achievement: 04/05/19 Potential to Achieve Goals: Good    Frequency Min 3X/week   Barriers to discharge        Co-evaluation               AM-PAC PT "6 Clicks" Mobility  Outcome Measure Help needed turning from your back to your side while in a flat bed without using bedrails?: A Little Help needed moving from lying on your back to sitting on the side of a flat bed without using bedrails?: A Little Help needed moving to and from a bed to a chair (including a wheelchair)?: A Little Help needed standing up from a chair using your arms (e.g., wheelchair or bedside chair)?: A Little Help needed to walk in hospital room?: A Little Help needed climbing 3-5 steps with a railing? : A Little 6 Click Score: 18    End of Session Equipment Utilized During Treatment: Gait belt Activity Tolerance: Patient limited by fatigue Patient left: in bed;with call bell/phone within reach;with bed alarm set;with nursing/sitter in room Nurse Communication: Mobility status PT Visit Diagnosis: Other abnormalities of gait and mobility (R26.89)    Time: [id number] PT Time Calculation (min) (ACUTE ONLY): 37 min   Charges:   PT Evaluation $PT Eval Low Complexity: 1 Low PT Treatments $Gait Training: 8-22 mins       Zenovia JarredKati Aayden Cefalu, PT, DPT Acute Rehabilitation Services Office: [phone] Pager: [phone] Sarajane JewsLEMYRE,KATHrine E 03/22/2019, 1:54 PM

## 2019-03-22 NOTE — Progress Notes (Signed)
Pt discouraged and questioning various tests/exams this am.  RN provided reassurance.  Pt provided ice chips, suction with yaunker, and will mobilize.  Ng tube clamped.  Pt much more cooperative.  RN will monitor.

## 2019-03-22 NOTE — Progress Notes (Signed)
Patient ID: Omar Bell, male   DOB: 05-Nov-1951, 68 y.o.   MRN: [id number]    4 Days Post-Op  Subjective: Patient continues to complain of SOB today.  In further discussion patient states he has a h/o asthma.  Has breathing treatments ordered here.  States that this spring season his SOB is much worse than it ever has been.  Mostly complains phlegm in his throat from NGT. Denies flatus, but then states he has had 2 BMs.  Denies pain in his legs.  Wants to mobilize  ROS: Please see subjective, + SOB, negative pian in legs or edema, negative chest pain, + weakness, - abdominal pain  Objective: Vital signs in last 24 hours: Temp:  [98.9 F (37.2 C)-99.7 F (37.6 C)] 98.9 F (37.2 C) (04/14 0609) Pulse Rate:  [95-102] 97 (04/14 0609) Resp:  [14-20] 14 (04/14 0609) BP: (111-121)/(68-86) 111/68 (04/14 0609) SpO2:  [94 %-97 %] 94 % (04/14 0609) Last BM Date: 03/22/19  Intake/Output from previous day: 04/13 0701 - 04/14 0700 In: 2902.1 [I.V.:2037.8; IV Piggyback:864.3] Out: 2875 [Urine:1550; Emesis/NG output:1300; Drains:25] Intake/Output this shift: No intake/output data recorded.  PE: Gen: NAD, laying in bed with sunglasses on  Heart: regular rate and rhythm, no M,G,R Lungs: CTAB, no increase WOB Abd: soft, less distention today with less abdominal wall anasarca, some BS, NGT in place with some bilious output, incisions c/d/i.  JP with minimal serosang output GU: penis with much less edema today.  Foley catheter in place with yellow urine present Ext: no pain in BLE, no edema, erythema, or warmth Psych: A&O x4  Lab Results:  Recent Labs    03/21/19 0314 03/22/19 0301  WBC 8.6 10.5  HGB 10.8* 10.2*  HCT 33.6* 32.7*  PLT 154 145*   BMET Recent Labs    03/21/19 0314 03/22/19 0301  NA 140 137  K 3.3* 3.7  CL 107 107  CO2 23 23  GLUCOSE 113* 126*  BUN 22 20  CREATININE 1.05 0.92  CALCIUM 7.2* 7.4*   PT/INR No results for input(s): LABPROT, INR in the last 72  hours. CMP     Component Value Date/Time   NA 137 03/22/2019 0301   K 3.7 03/22/2019 0301   CL 107 03/22/2019 0301   CO2 23 03/22/2019 0301   GLUCOSE 126 (H) 03/22/2019 0301   BUN 20 03/22/2019 0301   CREATININE 0.92 03/22/2019 0301   CALCIUM 7.4 (L) 03/22/2019 0301   PROT 9.1 (H) 03/18/2019 1601   ALBUMIN 4.6 03/18/2019 1601   AST 29 03/18/2019 1601   ALT 33 03/18/2019 1601   ALKPHOS 65 03/18/2019 1601   BILITOT 2.3 (H) 03/18/2019 1601   GFRNONAA >60 03/22/2019 0301   GFRAA >60 03/22/2019 0301   Lipase     Component Value Date/Time   LIPASE 24 03/18/2019 1601       Studies/Results: Dg Abd 1 View  Result Date: 03/20/2019 CLINICAL DATA:  Check gastric catheter placement EXAM: ABDOMEN - 1 VIEW COMPARISON:  03/18/2019 FINDINGS: Scattered large and small bowel gas is noted. Gastric catheter is noted within the stomach extending into the proximal duodenum. Persistent small bowel dilatation is noted on the left similar to that seen on the prior exam. IMPRESSION: Gastric catheter within the stomach extending into the proximal duodenum. Electronically Signed   By: Alcide Clever M.D.   On: 03/20/2019 19:31   Dg Abd Portable 1v  Result Date: 03/21/2019 CLINICAL DATA:  Abdominal distension. EXAM: PORTABLE ABDOMEN - 1 VIEW  COMPARISON:  March 20, 2019 FINDINGS: The NG tube now terminates in the third portion of the duodenum. Dilated loops of small bowel are seen in the left lateral abdomen. A small amount of gas is seen throughout the colon to the level the rectum. There is a surgical drain in the pelvis. There is air in the right lateral abdominal wall, likely postsurgical. No other acute abnormalities noted. IMPRESSION: 1. The NG tube terminates in the third portion of the duodenum. Consider withdrawing several cm so the distal tip and side port are in the stomach. 2. Dilated loops of small bowel in the left lateral abdomen could represent continued obstruction versus focal ileus. Recommend  clinical correlation and attention on follow-up. 3. Air in the right lateral abdominal wall favored to be postsurgical given history. These results will be called to the ordering clinician or representative by the Radiologist Assistant, and communication documented in the PACS or zVision Dashboard. Electronically Signed   By: Gerome Sam III M.D   On: 03/21/2019 11:09    Anti-infectives: Anti-infectives (From admission, onward)   Start     Dose/Rate Route Frequency Ordered Stop   03/19/19 0200  piperacillin-tazobactam (ZOSYN) IVPB 3.375 g  Status:  Discontinued     3.375 g 12.5 mL/hr over 240 Minutes Intravenous Every 8 hours 03/19/19 0028 03/19/19 0036   03/19/19 0200  piperacillin-tazobactam (ZOSYN) IVPB 3.375 g     3.375 g 12.5 mL/hr over 240 Minutes Intravenous Every 8 hours 03/19/19 0036 03/24/19 0159   03/18/19 2038  clindamycin (CLEOCIN) 900 mg, gentamicin (GARAMYCIN) 240 mg in sodium chloride 0.9 % 1,000 mL for intraperitoneal lavage  Status:  Discontinued       As needed 03/18/19 2038 03/18/19 2253   03/18/19 1845  ceFAZolin (ANCEF) IVPB 2g/100 mL premix     2 g 200 mL/hr over 30 Minutes Intravenous On call to O.R. 03/18/19 1747 03/18/19 1953   03/18/19 1845  metroNIDAZOLE (FLAGYL) IVPB 500 mg     500 mg 100 mL/hr over 60 Minutes Intravenous On call to O.R. 03/18/19 1747 03/18/19 1953   03/18/19 1830  clindamycin (CLEOCIN) 900 mg, gentamicin (GARAMYCIN) 240 mg in sodium chloride 0.9 % 1,000 mL for intraperitoneal lavage  Status:  Discontinued    Note to Pharmacy:  Have in the  OR room for final irrigation in bowel surgery case to minimize risk of abscess/infection Pharmacy may adjust dosing strength, schedule, rate of infusion, etc as needed to optimize therapy    Irrigation To Surgery 03/18/19 1828 03/19/19 0002   03/18/19 1806  metroNIDAZOLE (FLAGYL) 5-0.79 MG/ML-% IVPB    Note to Pharmacy:  [name]   : cabinet override      03/18/19 1806 03/18/19 1923   03/18/19  1806  ceFAZolin (ANCEF) 2-4 GM/100ML-% IVPB    Note to Pharmacy:  [name]   : cabinet override      03/18/19 1806 03/18/19 1923       Assessment/Plan  POD 4, s/p laparoscopic BIH repairs with absorbable Phasix mesh with SBR for 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  If output low may pull later today.  Had 2 BMs overnight -mobilize and pulm toilet.  Only pulling around 250-500 -dressing removed from pfannenstiel incision.  Dry dressing -cont JP drain for now and follow -PT eval for mobilization -cont zosyn for 5 days  ARF - resolved, Cr 0.92 Tachycardia - improving tachypnea/dyspnea -  will hold on CT angio of chest for right now.  Check PCXR.  Duplex of legs to rule out DVT (suspicion is low).  Will also obtain an echo given new RBBB and persistent dyspnea that is worsening over the last couple of months .  20mg  of lasix today as well.   AMS - improved, DC CT of head.  Will follow Hypokalemia - 3.7 today, will check BMET in am given he will get one small dose of lasix today  FEN -clamp NGT/IVFs, give small dose of lasix VTE - Lovenox, SCDs ID - Zosyn D4/5   LOS: 4 days    Letha CapeKelly E Frida Wahlstrom , [facility] 03/22/2019, 9:29 AM Pager: [phone]

## 2019-03-22 NOTE — Progress Notes (Signed)
Bilateral lower extremity venous duplex has been completed. Preliminary results can be found in CV Proc through chart review.   03/22/19 10:49 AM Olen Cordial RVT

## 2019-03-22 NOTE — Plan of Care (Signed)
  Problem: Education: Goal: Required Educational Video(s) Outcome: Progressing   Problem: Clinical Measurements: Goal: Ability to maintain clinical measurements within normal limits will improve Outcome: Progressing Goal: Postoperative complications will be avoided or minimized Outcome: Progressing   Problem: Skin Integrity: Goal: Demonstration of wound healing without infection will improve Outcome: Progressing   Problem: Health Behavior/Discharge Planning: Goal: Ability to manage health-related needs will improve Outcome: Progressing   Problem: Activity: Goal: Risk for activity intolerance will decrease Outcome: Progressing   Problem: Nutrition: Goal: Adequate nutrition will be maintained Outcome: Progressing   Problem: Coping: Goal: Level of anxiety will decrease Outcome: Progressing   Problem: Elimination: Goal: Will not experience complications related to bowel motility Outcome: Progressing Goal: Will not experience complications related to urinary retention Outcome: Progressing   Problem: Pain Managment: Goal: General experience of comfort will improve Outcome: Progressing  Plan of care discussed with patient

## 2019-03-22 NOTE — Progress Notes (Signed)
PROGRESS NOTE  Omar Bell GNF:[id number] DOB: May 14, 1951 DOA: 03/18/2019 PCP: [name], [name], MD   LOS: 4 days   Brief narrative: Omar Bell is an 68 y.o. male with prior h/o hyperlipidemia, initially presented to ED with incarcerated left inguinal hernia perforation of the jejunum causing SBO, underwent laparoscopic lysis of adhesions, small bowel resection, lap bil inguinal and right femoral hernia repairs with absorbable mesh, by surgery on 03/18/2019.  Medical team was consulted for confusion, tachycardia tachypnea.   Subjective: Patient complains of hotness of breath today but has a history of asthma.  Denies any chest pain.  Denies fever or chills.  Denies abdominal pain.  On NG tube.  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  Ammonia 21.  TSH 4.2.  Vitamin B12 within normal limits.  Call deficits.  No indication for CT scan of the head at this time.  Tachycardia and tachypnea.  X-ray showed possible left lower lobe consolidation  with bilateral pleural effusion.  Likely from fluid overload with significant positive fluid balance.  Does not have fever or leukocytosis.  Tachycardia has improved.  Continue oxygen, nebulizers for history of bronchial asthma.  Try 1 dose of IV Lasix today.  BNP slightly elevated.  Check echocardiogram.  Will hold off with antibiotics at this time.  Consider repeat Lasix in a.m. if the  patient remains dyspneic/think  Hypokalemia.  Improved compared to yesterday.  Will replenish through IV since patient is n.p.o. and on NG tube.  Continue to aggressively replenish.  Incarcerated left inguinal hernia with gangrenous perforation of the jejunum and SBO.  Status post laparoscopic repairs.  Continue n.p.o. with NG tube.  Management as per surgery  Acute anemia of blood loss from surgery Transfuse to keep hemoglobin greater than 7. Hemoglobin on admission was 17.3, currently it is around 10.2.  Acute kidney injury Improved at this time.  Try 1 dose of Lasix today.  Check BMP in a.m. patient is positive for 6166 mL  Right bundle branch block on EKG; unclear if new or old.  Pt denies chest pain. Troponin 2 sets have been negative on admission.    VTE Prophylaxis: Lovenox  Code Status:  Full Code  Family Communication: No one is available at bedside  Procedures: s/p laparoscopic BIH repairs with absorbable Phasix mesh with SBR for recurrent incarcerated LIH with gangrenous perforation of jejunum and SBO as well as right femoral hernia  Antibiotics: Anti-infectives (From admission, onward)   Start     Dose/Rate Route Frequency Ordered Stop   03/19/19 0200  piperacillin-tazobactam (ZOSYN) IVPB 3.375 g  Status:  Discontinued     3.375 g 12.5 mL/hr over 240 Minutes Intravenous Every 8 hours 03/19/19 0028 03/19/19 0036   03/19/19 0200  piperacillin-tazobactam (ZOSYN) IVPB 3.375 g     3.375 g 12.5 mL/hr over 240 Minutes Intravenous Every 8 hours 03/19/19 0036 03/24/19 0159   03/18/19 2038  clindamycin (CLEOCIN) 900 mg, gentamicin (GARAMYCIN)  240 mg in sodium chloride 0.9 % 1,000 mL for intraperitoneal lavage  Status:  Discontinued       As needed 03/18/19 2038 03/18/19 2253   03/18/19 1845  ceFAZolin (ANCEF) IVPB 2g/100 mL premix     2 g 200 mL/hr over 30 Minutes Intravenous On call to O.R. 03/18/19 1747 03/18/19 1953   03/18/19 1845  metroNIDAZOLE (FLAGYL) IVPB 500 mg      500 mg 100 mL/hr over 60 Minutes Intravenous On call to O.R. 03/18/19 1747 03/18/19 1953   03/18/19 1830  clindamycin (CLEOCIN) 900 mg, gentamicin (GARAMYCIN) 240 mg in sodium chloride 0.9 % 1,000 mL for intraperitoneal lavage  Status:  Discontinued    Note to Pharmacy:  Have in the  OR room for final irrigation in bowel surgery case to minimize risk of abscess/infection Pharmacy may adjust dosing strength, schedule, rate of infusion, etc as needed to optimize therapy    Irrigation To Surgery 03/18/19 1828 03/19/19 0002   03/18/19 1806  metroNIDAZOLE (FLAGYL) 5-0.79 MG/ML-% IVPB    Note to Pharmacy:  [name]   : cabinet override      03/18/19 1806 03/18/19 1923   03/18/19 1806  ceFAZolin (ANCEF) 2-4 GM/100ML-% IVPB    Note to Pharmacy:  [name]   : cabinet override      03/18/19 1806 03/18/19 1923      Objective: Vitals:   03/22/19 0237 03/22/19 0609  BP:  111/68  Pulse:  97  Resp:  14  Temp:  98.9 F (37.2 C)  SpO2: 96% 94%    Intake/Output Summary (Last 24 hours) at 03/22/2019 1010 Last data filed at 03/22/2019 0607 Gross per 24 hour  Intake 2558.49 ml  Output 2470 ml  Net 88.49 ml   Filed Weights   03/18/19 1534  Weight: 72.6 kg   Body mass index is 23.63 kg/m.   Physical Exam: GENERAL: Patient is alert awake and oriented. Not in obvious distress.  NG tube, wearing sunglasses. HENT: No scleral pallor or icterus. Pupils equally reactive to light. Oral mucosa is moist, NECK: is supple, no palpable thyroid enlargement. CHEST: Diminished breath sounds bilaterally, coarse breath sounds.  CVS: S1 and S2 heard, no murmur. Regular rate and rhythm. No pericardial rub. ABDOMEN: Soft, bowel sounds present, abdominal wall anasarca, JP drain with minimal output.  Incision site clean dry and intact.  Foley catheter in place. EXTREMITIES: Trace lower extremity edema CNS: Cranial nerves are intact. No focal motor or sensory deficits. SKIN: warm and dry without  rashes.  Data Review: I have personally reviewed the following laboratory data and studies,  CBC: Recent Labs  Lab 03/18/19 1601 03/19/19 0759 03/20/19 0407 03/21/19 0314 03/22/19 0301  WBC 19.5* 2.7* 7.8 8.6 10.5  HGB 17.3* 14.8 12.2* 10.8* 10.2*  HCT 50.3 44.5 37.7* 33.6* 32.7*  MCV 90.6 92.7 94.7 95.5 95.9  PLT 235 185 173 154 145*   Basic Metabolic Panel: Recent Labs  Lab 03/18/19 1601 03/18/19 2318 03/19/19 0850 03/20/19 0407 03/21/19 0314 03/22/19 0301  NA 140  --  140 142 140 137  K 2.4*  --  2.4* 3.1* 3.3* 3.7  CL 93*  --  105 105 107 107  CO2 32  --  GLUCOSE 169*  --  122* 107* 113* 126*  BUN 27*  --  25* 26* 22 20  CREATININE 1.17  --  1.24 1.39* 1.05 0.92  CALCIUM 9.2  --  6.8* 6.7* 7.2* 7.4*  MG  --  2.7* 2.2 2.4  --   --    Liver Function Tests: Recent Labs  Lab 03/18/19 1601  AST 29  ALT 33  ALKPHOS 65  BILITOT 2.3*  PROT 9.1*  ALBUMIN 4.6   Recent Labs  Lab 03/18/19 1601  LIPASE 24   Recent Labs  Lab 03/21/19 1401  AMMONIA 21   Cardiac Enzymes: Recent Labs  Lab 03/18/19 2318 03/19/19 0246  TROPONINI 0.03* <0.03   BNP (last 3 results) Recent Labs    03/21/19 1657  BNP 185.1*    ProBNP (last 3 results) No results for input(s): PROBNP in the last 8760 hours.  CBG: No results for input(s): GLUCAP in the last 168 hours. No results found for this or any previous visit (from the past 240 hour(s)).   Studies: Dg Abd 1 View  Result Date: 03/20/2019 CLINICAL DATA:  Check gastric catheter placement EXAM: ABDOMEN - 1 VIEW COMPARISON:  03/18/2019 FINDINGS: Scattered large and small bowel gas is noted. Gastric catheter is noted within the stomach extending into the proximal duodenum. Persistent small bowel dilatation is noted on the left similar to that seen on the prior exam. IMPRESSION: Gastric catheter within the stomach extending into the proximal duodenum. Electronically Signed   By: Alcide Clever M.D.   On:  03/20/2019 19:31   Dg Chest Port 1 View  Result Date: 03/22/2019 CLINICAL DATA:  Shortness of breath EXAM: PORTABLE CHEST 1 VIEW COMPARISON:  March 18, 2019 FINDINGS: Nasogastric tube tip and side port are below the diaphragm. No pneumothorax. There is consolidation in the left base. There are small pleural effusions bilaterally. Heart size and pulmonary vascular normal. There is aortic atherosclerosis. No adenopathy. No bone lesions. IMPRESSION: Left lower lobe consolidation, felt to represent pneumonia. Small pleural effusions bilaterally. Heart size within normal limits. Nasogastric tube tip and side port below diaphragm. Aortic Atherosclerosis (ICD10-I70.0). Electronically Signed   By: Bretta Bang III M.D.   On: 03/22/2019 09:48   Dg Abd Portable 1v  Result Date: 03/21/2019 CLINICAL DATA:  Abdominal distension. EXAM: PORTABLE ABDOMEN - 1 VIEW COMPARISON:  March 20, 2019 FINDINGS: The NG tube now terminates in the third portion of the duodenum. Dilated loops of small bowel are seen in the left lateral abdomen. A small amount of gas is seen throughout the colon to the level the rectum. There is a surgical drain in the pelvis. There is air in the right lateral abdominal wall, likely postsurgical. No other acute abnormalities noted. IMPRESSION: 1. The NG tube terminates in the third portion of the duodenum. Consider withdrawing several cm so the distal tip and side port are in the stomach. 2. Dilated loops of small bowel in the left lateral abdomen could represent continued obstruction versus focal ileus. Recommend clinical correlation and attention on follow-up. 3. Air in the right lateral abdominal wall favored to be postsurgical given history. These results will be called to the ordering clinician or representative by the Radiologist Assistant, and communication documented in the PACS or zVision Dashboard. Electronically Signed   By: Gerome Sam III M.D   On: 03/21/2019 11:09    Scheduled  Meds: . dextromethorphan-guaiFENesin  1 tablet Oral BID  . enoxaparin (LOVENOX) injection  40 mg Subcutaneous Q24H  . lip balm  1 application Topical BID  . metoprolol tartrate  2.5 mg Intravenous Q8H    Continuous Infusions: . acetaminophen Stopped (03/22/19 0050)  . acetaminophen    . dextrose 5 % and 0.45 %  NaCl with KCl 20 mEq/L 75 mL/hr at 03/22/19 0835  . methocarbamol (ROBAXIN) IV    . ondansetron (ZOFRAN) IV    . piperacillin-tazobactam (ZOSYN)  IV 3.375 g (03/22/19 0935)     Joycelyn Das, MD  [facility] 03/22/2019

## 2019-03-22 NOTE — Progress Notes (Signed)
  Echocardiogram 2D Echocardiogram has been performed.  Leta Jungling M 03/22/2019, 12:15 PM

## 2019-03-23 ENCOUNTER — Inpatient Hospital Stay (HOSPITAL_COMMUNITY): Payer: 59

## 2019-03-23 DIAGNOSIS — E860 Dehydration: Secondary | ICD-10-CM

## 2019-03-23 DIAGNOSIS — K631 Perforation of intestine (nontraumatic): Secondary | ICD-10-CM

## 2019-03-23 LAB — BASIC METABOLIC PANEL
Anion gap: 7 (ref 5–15)
BUN: 22 mg/dL (ref 8–23)
CO2: 24 mmol/L (ref 22–32)
Calcium: 7.5 mg/dL — ABNORMAL LOW (ref 8.9–10.3)
Chloride: 103 mmol/L (ref 98–111)
Creatinine, Ser: 0.87 mg/dL (ref 0.61–1.24)
GFR calc Af Amer: >60 mL/min (ref >60)
GFR calc non Af Amer: >60 mL/min (ref >60)
Glucose, Bld: 118 mg/dL — ABNORMAL HIGH (ref 70–99)
Potassium: 3.8 mmol/L (ref 3.5–5.1)
Sodium: 134 mmol/L — ABNORMAL LOW (ref 135–145)

## 2019-03-23 LAB — CBC
HCT: 33.6 % — ABNORMAL LOW (ref 39.0–52.0)
Hemoglobin: 10.5 g/dL — ABNORMAL LOW (ref 13.0–17.0)
MCH: 30.2 pg (ref 26.0–34.0)
MCHC: 31.3 g/dL (ref 30.0–36.0)
MCV: 96.6 fL (ref 80.0–100.0)
Platelets: 177 10*3/uL (ref 150–400)
RBC: 3.48 MIL/uL — ABNORMAL LOW (ref 4.22–5.81)
RDW: 13.6 % (ref 11.5–15.5)
WBC: 10.9 10*3/uL — ABNORMAL HIGH (ref 4.0–10.5)
nRBC: 0 % (ref 0.0–0.2)

## 2019-03-23 LAB — MAGNESIUM: Magnesium: 2.1 mg/dL (ref 1.7–2.4)

## 2019-03-23 MED ORDER — FUROSEMIDE 10 MG/ML IJ SOLN
20.0000 mg | Freq: Once | INTRAMUSCULAR | Status: AC
  Administered 2019-03-23: 09:00:00 20 mg via INTRAVENOUS
  Filled 2019-03-23: qty 2

## 2019-03-23 MED ORDER — SIMETHICONE 80 MG PO CHEW
40.0000 mg | CHEWABLE_TABLET | ORAL | Status: DC | PRN
  Administered 2019-03-23 – 2019-03-25 (×4): 40 mg via ORAL
  Filled 2019-03-23 (×4): qty 1

## 2019-03-23 MED ORDER — ACETAMINOPHEN 325 MG PO TABS
650.0000 mg | ORAL_TABLET | Freq: Four times a day (QID) | ORAL | Status: DC | PRN
  Administered 2019-03-23 – 2019-03-26 (×2): 650 mg via ORAL
  Filled 2019-03-23 (×3): qty 2

## 2019-03-23 MED ORDER — HYDROGEN PEROXIDE 3 % EX SOLN
CUTANEOUS | Status: AC
  Administered 2019-03-23: 17:00:00
  Filled 2019-03-23: qty 473

## 2019-03-23 NOTE — Progress Notes (Signed)
Physical Therapy Treatment Patient Details Name: Omar Bell MRN: [id number] DOB: [birth year] Today's Date: 03/23/2019    History of Present Illness Pt is a 68 year old male s/p laparoscopic BIH repairs with absorbable Phasix mesh with SBR for recurrent incarcerated LIH with gangrenous perforation of jejunum and SBO as well as right femoral hernia on 03/18/19    PT Comments    Assisted OOB to amb in hallway.  General transfer comment: cues for hand placement. assist to rise and control descent.  Instructed to park walker against wall prior to sitting.  General Gait Details: Used a Rollator this session as pt required several seated rest breaks.  Avg HR 90 and RA was 96%.  Max c/o weakness/fatigue.  Several days of NPO and recent surgery.    Follow Up Recommendations  Home health PT;Supervision for mobility/OOB     Equipment Recommendations  [phone](4WW with seat)  Rollator  Recommendations for Other Services       Precautions / Restrictions Precautions Precautions: Fall Precaution Comments: JP Drain Restrictions Weight Bearing Restrictions: No    Mobility  Bed Mobility Overal bed mobility: Needs Assistance Bed Mobility: Supine to Sit     Supine to sit: Mod assist     General bed mobility comments: verbal cues for log roll technique, assist for trunk   Transfers Overall transfer level: Needs assistance Equipment used: 4-wheeled walker Transfers: Sit to/from BJ'sStand;Stand Pivot Transfers Sit to Stand: Supervision;Min guard Stand pivot transfers: Supervision;Min guard       General transfer comment: cues for hand placement. assist to rise and control descent.  Instructed to park walker against wall prior to sitting  Ambulation/Gait Ambulation/Gait assistance: Min guard;Min assist Gait Distance (Feet): 400 Feet Assistive device: 4-wheeled walker Gait Pattern/deviations: Step-to pattern;Step-through pattern;Decreased stance time - right;Decreased step length -  left;Decreased step length - right;Trunk flexed Gait velocity: decreased    General Gait Details: Used a Rollator this session as pt required several seated rest breaks.  Avg HR 90 and RA was 96%.  Max c/o weakness/fatigue.  Several days of NPO and recent surgery.     Stairs             Wheelchair Mobility    Modified Rankin (Stroke Patients Only)       Balance                                            Cognition Arousal/Alertness: Awake/alert Behavior During Therapy: WFL for tasks assessed/performed Overall Cognitive Status: Within Functional Limits for tasks assessed                                 General Comments: required increased time, moves slow, easily distracted      Exercises      General Comments        Pertinent Vitals/Pain Pain Assessment: Faces Faces Pain Scale: Hurts little more Pain Location: ABD with activity Pain Descriptors / Indicators: Discomfort;Grimacing;Tender Pain Intervention(s): Monitored during session    Home Living                      Prior Function            PT Goals (current goals can now be found in the care plan section) Progress towards PT goals: Progressing toward goals  Frequency    Min 3X/week      PT Plan Current plan remains appropriate    Co-evaluation              AM-PAC PT "6 Clicks" Mobility   Outcome Measure  Help needed turning from your back to your side while in a flat bed without using bedrails?: A Little Help needed moving from lying on your back to sitting on the side of a flat bed without using bedrails?: A Little Help needed moving to and from a bed to a chair (including a wheelchair)?: A Little Help needed standing up from a chair using your arms (e.g., wheelchair or bedside chair)?: A Little Help needed to walk in hospital room?: A Little Help needed climbing 3-5 steps with a railing? : A Little 6 Click Score: 18    End of Session  Equipment Utilized During Treatment: Gait belt Activity Tolerance: Patient limited by fatigue Patient left: in bed;with call bell/phone within reach;with nursing/sitter in room Nurse Communication: Mobility status PT Visit Diagnosis: Other abnormalities of gait and mobility (R26.89)     Time: 2395-3202 PT Time Calculation (min) (ACUTE ONLY): 28 min  Charges:  $Gait Training: 8-22 mins $Therapeutic Activity: 8-22 mins                     [name]  PTA Acute  Rehabilitation Services Pager      [phone] Office      [phone]

## 2019-03-23 NOTE — Progress Notes (Signed)
Patient ID: Omar Bell, male   DOB: 11/03/1951, 68 y.o.   MRN: [id number] Updated daughter Omar Bell [phone] on patient condition

## 2019-03-23 NOTE — Progress Notes (Signed)
5 Days Post-Op   Subjective/Chief Complaint: Hiccups, had a bm, ng out, [place] and echo ok, no real complaints this am, negative yesterday after diuresis, neg sob, no chest pain, no n/v   Objective: Vital signs in last 24 hours: Temp:  [98.7 F (37.1 C)-99 F (37.2 C)] 99 F (37.2 C) (04/15 0613) Pulse Rate:  [98-106] 98 (04/15 0613) Resp:  [18-20] 18 (04/15 0600) BP: (126-131)/(67-87) 126/87 (04/15 4599) SpO2:  [97 %-100 %] 97 % (04/15 0613) Last BM Date: 03/21/19  Intake/Output from previous day: 04/14 0701 - 04/15 0700 In: 1309.4 [I.V.:1159.4; IV Piggyback:150] Out: 4790 [Urine:4350; Emesis/NG output:340; Drains:100] Intake/Output this shift: No intake/output data recorded.  Gen: NAD, laying in bed with sunglasses on again Heart: rrr Lungs: clear bilaterally Abd: soft, less distention today incisions c/d/i.  JP with minimal serosang output GU: penis with much less edema today.  Foley catheter in place with yellow urine present Ext: no pain in BLE, no edema, erythema, or warmth Psych: A&O x4  Lab Results:  Recent Labs    03/22/19 0301 03/23/19 0509  WBC 10.5 10.9*  HGB 10.2* 10.5*  HCT 32.7* 33.6*  PLT 145* 177   BMET Recent Labs    03/22/19 0301 03/23/19 0509  NA 137 134*  K 3.7 3.8  CL 107 103  CO2 23 24  GLUCOSE 126* 118*  BUN 20 22  CREATININE 0.92 0.87  CALCIUM 7.4* 7.5*   PT/INR No results for input(s): LABPROT, INR in the last 72 hours. ABG No results for input(s): PHART, HCO3 in the last 72 hours.  Invalid input(s): PCO2, PO2  Studies/Results: Dg Chest Port 1 View  Result Date: 03/22/2019 CLINICAL DATA:  Shortness of breath EXAM: PORTABLE CHEST 1 VIEW COMPARISON:  March 18, 2019 FINDINGS: Nasogastric tube tip and side port are below the diaphragm. No pneumothorax. There is consolidation in the left base. There are small pleural effusions bilaterally. Heart size and pulmonary vascular normal. There is aortic atherosclerosis. No adenopathy. No  bone lesions. IMPRESSION: Left lower lobe consolidation, felt to represent pneumonia. Small pleural effusions bilaterally. Heart size within normal limits. Nasogastric tube tip and side port below diaphragm. Aortic Atherosclerosis (ICD10-I70.0). Electronically Signed   By: Bretta Bang III M.D.   On: 03/22/2019 09:48   Dg Abd Portable 1v  Result Date: 03/21/2019 CLINICAL DATA:  Abdominal distension. EXAM: PORTABLE ABDOMEN - 1 VIEW COMPARISON:  March 20, 2019 FINDINGS: The NG tube now terminates in the third portion of the duodenum. Dilated loops of small bowel are seen in the left lateral abdomen. A small amount of gas is seen throughout the colon to the level the rectum. There is a surgical drain in the pelvis. There is air in the right lateral abdominal wall, likely postsurgical. No other acute abnormalities noted. IMPRESSION: 1. The NG tube terminates in the third portion of the duodenum. Consider withdrawing several cm so the distal tip and side port are in the stomach. 2. Dilated loops of small bowel in the left lateral abdomen could represent continued obstruction versus focal ileus. Recommend clinical correlation and attention on follow-up. 3. Air in the right lateral abdominal wall favored to be postsurgical given history. These results will be called to the ordering clinician or representative by the Radiologist Assistant, and communication documented in the PACS or zVision Dashboard. Electronically Signed   By: Gerome Sam III M.D   On: 03/21/2019 11:09   Vas [place] Lower Extremity Venous (dvt)  Result Date: 03/22/2019  Lower  Venous Study Indications: Edema.  Performing Technologist: Chanda BusingGregory Collins RVT  Examination Guidelines: A complete evaluation includes B-mode imaging, spectral Doppler, color Doppler, and power Doppler as needed of all accessible portions of each vessel. Bilateral testing is considered an integral part of a complete examination. Limited examinations for reoccurring  indications may be performed as noted.  +---------+---------------+---------+-----------+----------+-------+ RIGHT    CompressibilityPhasicitySpontaneityPropertiesSummary +---------+---------------+---------+-----------+----------+-------+ CFV      Full           Yes      Yes                          +---------+---------------+---------+-----------+----------+-------+ SFJ      Full                                                 +---------+---------------+---------+-----------+----------+-------+ FV Prox  Full                                                 +---------+---------------+---------+-----------+----------+-------+ FV Mid   Full                                                 +---------+---------------+---------+-----------+----------+-------+ FV DistalFull                                                 +---------+---------------+---------+-----------+----------+-------+ PFV      Full                                                 +---------+---------------+---------+-----------+----------+-------+ POP      Full           Yes      Yes                          +---------+---------------+---------+-----------+----------+-------+ PTV      Full                                                 +---------+---------------+---------+-----------+----------+-------+ PERO     Full                                                 +---------+---------------+---------+-----------+----------+-------+   +---------+---------------+---------+-----------+----------+-------+ LEFT     CompressibilityPhasicitySpontaneityPropertiesSummary +---------+---------------+---------+-----------+----------+-------+ CFV      Full           Yes      Yes                          +---------+---------------+---------+-----------+----------+-------+  SFJ      Full                                                  +---------+---------------+---------+-----------+----------+-------+ FV Prox  Full                                                 +---------+---------------+---------+-----------+----------+-------+ FV Mid   Full                                                 +---------+---------------+---------+-----------+----------+-------+ FV DistalFull                                                 +---------+---------------+---------+-----------+----------+-------+ PFV      Full                                                 +---------+---------------+---------+-----------+----------+-------+ POP      Full           Yes      Yes                          +---------+---------------+---------+-----------+----------+-------+ PTV      Full                                                 +---------+---------------+---------+-----------+----------+-------+ PERO     Full                                                 +---------+---------------+---------+-----------+----------+-------+     Summary: Right: There is no evidence of deep vein thrombosis in the lower extremity. No cystic structure found in the popliteal fossa. Left: There is no evidence of deep vein thrombosis in the lower extremity. No cystic structure found in the popliteal fossa.  *See table(s) above for measurements and observations. Electronically signed by Coral Else MD on 03/22/2019 at 12:32:30 PM.    Final     Anti-infectives: Anti-infectives (From admission, onward)   Start     Dose/Rate Route Frequency Ordered Stop   03/19/19 0200  piperacillin-tazobactam (ZOSYN) IVPB 3.375 g  Status:  Discontinued     3.375 g 12.5 mL/hr over 240 Minutes Intravenous Every 8 hours 03/19/19 0028 03/19/19 0036   03/19/19 0200  piperacillin-tazobactam (ZOSYN) IVPB 3.375 g     3.375 g 12.5 mL/hr over 240 Minutes Intravenous Every 8 hours 03/19/19 0036 03/24/19 0659   03/18/19 2038  clindamycin (CLEOCIN) 900 mg,  gentamicin (GARAMYCIN) 240 mg in  sodium chloride 0.9 % 1,000 mL for intraperitoneal lavage  Status:  Discontinued       As needed 03/18/19 2038 03/18/19 2253   03/18/19 1845  ceFAZolin (ANCEF) IVPB 2g/100 mL premix     2 g 200 mL/hr over 30 Minutes Intravenous On call to O.R. 03/18/19 1747 03/18/19 1953   03/18/19 1845  metroNIDAZOLE (FLAGYL) IVPB 500 mg     500 mg 100 mL/hr over 60 Minutes Intravenous On call to O.R. 03/18/19 1747 03/18/19 1953   03/18/19 1830  clindamycin (CLEOCIN) 900 mg, gentamicin (GARAMYCIN) 240 mg in sodium chloride 0.9 % 1,000 mL for intraperitoneal lavage  Status:  Discontinued    Note to Pharmacy:  Have in the  OR room for final irrigation in bowel surgery case to minimize risk of abscess/infection Pharmacy may adjust dosing strength, schedule, rate of infusion, etc as needed to optimize therapy    Irrigation To Surgery 03/18/19 1828 03/19/19 0002   03/18/19 1806  metroNIDAZOLE (FLAGYL) 5-0.79 MG/ML-% IVPB    Note to Pharmacy:  [name]   : cabinet override      03/18/19 1806 03/18/19 1923   03/18/19 1806  ceFAZolin (ANCEF) 2-4 GM/100ML-% IVPB    Note to Pharmacy:  [name]   : cabinet override      03/18/19 1806 03/18/19 1923      Assessment/Plan: POD 5, s/p laparoscopic BIH repairs with absorbable Phasix mesh with SBR for 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  -cont JP drain for now and follow, will remove prior to dc -PT eval for mobilization, note recs for home health -cont zosyn for 5 days, will stop tomorrow ARF- resolved, Cr 0.87, dc foley today, will give one more dose lasix as he was very negative yesterday and I think this is helping Tachycardia - resolved tachypnea/dyspnea- echo and duplex fine, this is  better today,   of lasix today as well.   AMS - resolved Hypokalemia- 3.8 today, will check BMET in am given he will get one small dose of lasix today FEN -remain on ice chips, hopefully increase diet soon as appears ileus resolving VTE -Lovenox, SCDs ID -Zosyn D5/5  Emelia Loron 03/23/2019

## 2019-03-23 NOTE — Progress Notes (Signed)
PROGRESS NOTE    Omar Bell  WUJ:[id number] DOB: [birth year] DOA: 03/18/2019 PCP: Benita Stabile, MD   Brief Narrative:  Omar Bell an 68 y.o.malewith prior h/o hyperlipidemia, initially presented to ED with incarcerated leftinguinal hernia perforation of the jejunumcausingSBO, underwent laparoscopiclysis of adhesions, small bowel resection, lap bil inguinal and right femoral hernia repairs with absorbable mesh,by surgery on 03/18/2019.  Medical team was consulted for confusion, tachycardia tachypnea.   Assessment & Plan   Acute metabolic encephalopathy -resolved, appears to be AAOx3 -Ammonia, TSH and vitamin B12 within normal limits -Given that no neuro deficits, and encephalopathy appears to resolved, no indication for CT of the head at this time  Tachycardia and tachypnea-likely acute diastolic heart failure -Chest x-ray showed possible left lower lobe consolidation with bilateral pleural effusion -Likely secondary to fluid overload given significant positive fluid balance -Currently afebrile with no leukocytosis -Tachycardia has improved -Continue nebulizer treatments as needed -Patient did receive IV Lasix -Continue to monitor intake and output, daily weights -Echocardiogram EF 60 to 65%, LV diastolic Doppler parameters are consistent with impaired relaxation -Extremity Doppler unremarkable for DVT  Hypokalemia -Potassium currently 3.8, continue to monitor BMP  Incarcerated left inguinal hernia with gangrenous perforation of the jejunum and SBO -Per general surgery  Acute anemia of blood loss -Secondary to surgery -Hemoglobin currently 10.5, was 14.80 on admission however suspect this is dilutional    -Continue to monitor CBC, transfuse if hemoglobin below 7  Acute kidney injury -Resolved, continue to monitor BMP  Right bundle branch block -No complaints of chest pain, troponin negative on admission x2 right bundle branch block   DVT Prophylaxis   Lovenox  Code Status: Full  Family Communication: None at bedside  Disposition Plan: Admitted. 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 as well as right femoral hernia  Echocardiogram  Lower extremity doppler  Antibiotics   Anti-infectives (From admission, onward)   Start     Dose/Rate Route Frequency Ordered Stop   03/19/19 0200  piperacillin-tazobactam (ZOSYN) IVPB 3.375 g  Status:  Discontinued     3.375 g 12.5 mL/hr over 240 Minutes Intravenous Every 8 hours 03/19/19 0028 03/19/19 0036   03/19/19 0200  piperacillin-tazobactam (ZOSYN) IVPB 3.375 g     3.375 g 12.5 mL/hr over 240 Minutes Intravenous Every 8 hours 03/19/19 0036 03/24/19 0659   03/18/19 2038  clindamycin (CLEOCIN) 900 mg, gentamicin (GARAMYCIN) 240 mg in sodium chloride 0.9 % 1,000 mL for intraperitoneal lavage  Status:  Discontinued       As needed 03/18/19 2038 03/18/19 2253   03/18/19 1845  ceFAZolin (ANCEF) IVPB 2g/100 mL premix     2 g 200 mL/hr over 30 Minutes Intravenous On call to O.R. 03/18/19 1747 03/18/19 1953   03/18/19 1845  metroNIDAZOLE (FLAGYL) IVPB 500 mg     500 mg 100 mL/hr over 60 Minutes Intravenous On call to O.R. 03/18/19 1747 03/18/19 1953   03/18/19 1830  clindamycin (CLEOCIN) 900 mg, gentamicin (GARAMYCIN) 240 mg in sodium chloride 0.9 % 1,000 mL for intraperitoneal lavage  Status:  Discontinued    Note to Pharmacy:  Have in the  OR room for final irrigation in bowel surgery case to minimize risk of abscess/infection Pharmacy may adjust dosing strength, schedule, rate of infusion, etc as needed to optimize therapy    Irrigation To Surgery 03/18/19 1828 03/19/19 0002  03/18/19 1806  metroNIDAZOLE (FLAGYL) 5-0.79 MG/ML-% IVPB    Note to Pharmacy:  [name]   : cabinet override      03/18/19 1806 03/18/19 1923   03/18/19 1806   ceFAZolin (ANCEF) 2-4 GM/100ML-% IVPB    Note to Pharmacy:  [name]   : cabinet override      03/18/19 1806 03/18/19 1923      Subjective:   Omar Bell seen and examined today.  No complaints today.  Denies current chest pain, shortness breath, nausea or vomiting, dizziness or headache.  Objective:   Vitals:   03/22/19 0609 03/22/19 1347 03/22/19 2104 03/23/19 0613  BP: 111/68 127/67 131/80 126/87  Pulse: 97 (!) 106 98 98  Resp: Temp: 98.9 F (37.2 C)  98.7 F (37.1 C) 99 F (37.2 C)  TempSrc: Oral  Oral Oral  SpO2: 94% 98% 100% 97%  Weight:      Height:        Intake/Output Summary (Last 24 hours) at 03/23/2019 1344 Last data filed at 03/23/2019 1303 Gross per 24 hour  Intake 1679.73 ml  Output 4465 ml  Net -2785.27 ml   Filed Weights   03/18/19 1534  Weight: 72.6 kg    Exam  General: Well developed, chronically ill appearing, NAD  HEENT: NCAT, mucous membranes moist.  Sunglasses on  Neck: Supple  Cardiovascular: S1 S2 auscultated, RRR  Respiratory: Clear to auscultation bilaterally   Abdomen: Soft, appropriately TTP, mildly distended, JP drain in place  Extremities: warm dry without cyanosis clubbing or edema  Neuro: AAOx3, nonfocal  Psych: Normal affect and demeanor with intact judgement and insight   Data Reviewed: I have personally reviewed following labs and imaging studies  CBC: Recent Labs  Lab 03/19/19 0759 03/20/19 0407 03/21/19 0314 03/22/19 0301 03/23/19 0509  WBC 2.7* 7.8 8.6 10.5 10.9*  HGB 14.8 12.2* 10.8* 10.2* 10.5*  HCT 44.5 37.7* 33.6* 32.7* 33.6*  MCV 92.7 94.7 95.5 95.9 96.6  PLT 185 173 154 145* 177   Basic Metabolic Panel: Recent Labs  Lab 03/18/19 2318 03/19/19 0850 03/20/19 0407 03/21/19 0314 03/22/19 0301 03/23/19 0509  NA  --  140 142 140 137 134*  K  --  2.4* 3.1* 3.3* 3.7 3.8  CL  --  105 105 107 107 103  CO2  --  GLUCOSE  --  122* 107* 113* 126* 118*  BUN  --   25* 26* CREATININE  --  1.24 1.39* 1.05 0.92 0.87  CALCIUM  --  6.8* 6.7* 7.2* 7.4* 7.5*  MG 2.7* 2.2 2.4  --   --  2.1   GFR: Estimated Creatinine Clearance: 80.1 mL/min (by C-G formula based on SCr of 0.87 mg/dL). Liver Function Tests: Recent Labs  Lab 03/18/19 1601  AST 29  ALT 33  ALKPHOS 65  BILITOT 2.3*  PROT 9.1*  ALBUMIN 4.6   Recent Labs  Lab 03/18/19 1601  LIPASE 24   Recent Labs  Lab 03/21/19 1401  AMMONIA 21   Coagulation Profile: No results for input(s): INR, PROTIME in the last 168 hours. Cardiac Enzymes: Recent Labs  Lab 03/18/19 2318 03/19/19 0246  TROPONINI 0.03* <0.03   BNP (last 3 results) No results for input(s): PROBNP in the last 8760 hours. HbA1C: No results for input(s): HGBA1C in the last 72 hours. CBG: No results for input(s): GLUCAP in the last 168 hours. Lipid Profile: No  results for input(s): CHOL, HDL, LDLCALC, TRIG, CHOLHDL, LDLDIRECT in the last 72 hours. Thyroid Function Tests: Recent Labs    03/22/19 0301  TSH 4.262   Anemia Panel: Recent Labs    03/22/19 0301  VITAMINB12 728   Urine analysis: No results found for: COLORURINE, APPEARANCEUR, LABSPEC, PHURINE, GLUCOSEU, HGBUR, BILIRUBINUR, KETONESUR, PROTEINUR, UROBILINOGEN, NITRITE, LEUKOCYTESUR Sepsis Labs: @LABRCNTIP (procalcitonin:4,lacticidven:4)  )No results found for this or any previous visit (from the past 240 hour(s)).    Radiology Studies: Dg Chest Port 1 View  Result Date: 03/22/2019 CLINICAL DATA:  Shortness of breath EXAM: PORTABLE CHEST 1 VIEW COMPARISON:  March 18, 2019 FINDINGS: Nasogastric tube tip and side port are below the diaphragm. No pneumothorax. There is consolidation in the left base. There are small pleural effusions bilaterally. Heart size and pulmonary vascular normal. There is aortic atherosclerosis. No adenopathy. No bone lesions. IMPRESSION: Left lower lobe consolidation, felt to represent pneumonia. Small pleural effusions  bilaterally. Heart size within normal limits. Nasogastric tube tip and side port below diaphragm. Aortic Atherosclerosis (ICD10-I70.0). Electronically Signed   By: Bretta BangWilliam  Woodruff III M.D.   On: 03/22/2019 09:48   Dg Abd Portable 1v  Result Date: 03/23/2019 CLINICAL DATA:  Postoperative ileus. EXAM: PORTABLE ABDOMEN - 1 VIEW COMPARISON:  Radiographs of March 21, 2019. FINDINGS: The bowel gas pattern is normal. No radio-opaque calculi or other significant radiographic abnormality are seen. Drainage catheter is noted in the pelvis. IMPRESSION: No evidence of bowel obstruction or ileus. Electronically Signed   By: Lupita RaiderJames  Green Jr M.D.   On: 03/23/2019 13:37   Vas [place] Lower Extremity Venous (dvt)  Result Date: 03/22/2019  Lower Venous Study Indications: Edema.  Performing Technologist: Chanda BusingGregory Collins RVT  Examination Guidelines: A complete evaluation includes B-mode imaging, spectral Doppler, color Doppler, and power Doppler as needed of all accessible portions of each vessel. Bilateral testing is considered an integral part of a complete examination. Limited examinations for reoccurring indications may be performed as noted.  +---------+---------------+---------+-----------+----------+-------+  RIGHT     Compressibility Phasicity Spontaneity Properties Summary  +---------+---------------+---------+-----------+----------+-------+  CFV       Full            Yes       Yes                             +---------+---------------+---------+-----------+----------+-------+  SFJ       Full                                                      +---------+---------------+---------+-----------+----------+-------+  FV Prox   Full                                                      +---------+---------------+---------+-----------+----------+-------+  FV Mid    Full                                                      +---------+---------------+---------+-----------+----------+-------+  FV Distal Full                                                       +---------+---------------+---------+-----------+----------+-------+  PFV       Full                                                      +---------+---------------+---------+-----------+----------+-------+  POP       Full            Yes       Yes                             +---------+---------------+---------+-----------+----------+-------+  PTV       Full                                                      +---------+---------------+---------+-----------+----------+-------+  PERO      Full                                                      +---------+---------------+---------+-----------+----------+-------+   +---------+---------------+---------+-----------+----------+-------+  LEFT      Compressibility Phasicity Spontaneity Properties Summary  +---------+---------------+---------+-----------+----------+-------+  CFV       Full            Yes       Yes                             +---------+---------------+---------+-----------+----------+-------+  SFJ       Full                                                      +---------+---------------+---------+-----------+----------+-------+  FV Prox   Full                                                      +---------+---------------+---------+-----------+----------+-------+  FV Mid    Full                                                      +---------+---------------+---------+-----------+----------+-------+  FV Distal Full                                                      +---------+---------------+---------+-----------+----------+-------+  PFV       Full                                                      +---------+---------------+---------+-----------+----------+-------+  POP       Full            Yes       Yes                             +---------+---------------+---------+-----------+----------+-------+  PTV       Full                                                       +---------+---------------+---------+-----------+----------+-------+  PERO      Full                                                      +---------+---------------+---------+-----------+----------+-------+     Summary: Right: There is no evidence of deep vein thrombosis in the lower extremity. No cystic structure found in the popliteal fossa. Left: There is no evidence of deep vein thrombosis in the lower extremity. No cystic structure found in the popliteal fossa.  *See table(s) above for measurements and observations. Electronically signed by Coral Else MD on 03/22/2019 at 12:32:30 PM.    Final      Scheduled Meds:  dextromethorphan-guaiFENesin  1 tablet Oral BID   enoxaparin (LOVENOX) injection  40 mg Subcutaneous Q24H   lip balm  1 application Topical BID   metoprolol tartrate  2.5 mg Intravenous Q8H   Continuous Infusions:  dextrose 5 % and 0.45 % NaCl with KCl 20 mEq/L 50 mL/hr at 03/23/19 0814   methocarbamol (ROBAXIN) IV     ondansetron (ZOFRAN) IV     piperacillin-tazobactam (ZOSYN)  IV 3.375 g (03/23/19 0710)     LOS: 5 days   Time Spent in minutes   30 minutes  Yosmar Ryker D.O. on 03/23/2019 at 1:44 PM  Between 7am to 7pm - Please see pager noted on [email or web address]  After 7pm go to [email or web address]  And look for the night coverage person covering for me after hours  [facility] Office  [phone]

## 2019-03-23 NOTE — Plan of Care (Signed)
  Problem: Education: Goal: Required Educational Video(s) Outcome: Progressing   Problem: Clinical Measurements: Goal: Ability to maintain clinical measurements within normal limits will improve Outcome: Progressing Goal: Postoperative complications will be avoided or minimized Outcome: Progressing   Problem: Skin Integrity: Goal: Demonstration of wound healing without infection will improve Outcome: Progressing   Problem: Health Behavior/Discharge Planning: Goal: Ability to manage health-related needs will improve Outcome: Progressing   Problem: Activity: Goal: Risk for activity intolerance will decrease Outcome: Progressing   Problem: Nutrition: Goal: Adequate nutrition will be maintained Outcome: Progressing   Problem: Coping: Goal: Level of anxiety will decrease Outcome: Progressing   Problem: Elimination: Goal: Will not experience complications related to bowel motility Outcome: Progressing Goal: Will not experience complications related to urinary retention Outcome: Progressing   Problem: Pain Managment: Goal: General experience of comfort will improve Outcome: Progressing  Discussed plan of care with patient and daughter on the phone

## 2019-03-24 ENCOUNTER — Inpatient Hospital Stay (HOSPITAL_COMMUNITY): Payer: 59

## 2019-03-24 ENCOUNTER — Inpatient Hospital Stay: Payer: Self-pay

## 2019-03-24 LAB — CBC
HCT: 34.9 % — ABNORMAL LOW (ref 39.0–52.0)
Hemoglobin: 11.2 g/dL — ABNORMAL LOW (ref 13.0–17.0)
MCH: 30.6 pg (ref 26.0–34.0)
MCHC: 32.1 g/dL (ref 30.0–36.0)
MCV: 95.4 fL (ref 80.0–100.0)
Platelets: 205 10*3/uL (ref 150–400)
RBC: 3.66 MIL/uL — ABNORMAL LOW (ref 4.22–5.81)
RDW: 13.3 % (ref 11.5–15.5)
WBC: 13.8 10*3/uL — ABNORMAL HIGH (ref 4.0–10.5)
nRBC: 0 % (ref 0.0–0.2)

## 2019-03-24 LAB — BASIC METABOLIC PANEL
Anion gap: 9 (ref 5–15)
BUN: 24 mg/dL — ABNORMAL HIGH (ref 8–23)
CO2: 22 mmol/L (ref 22–32)
Calcium: 7.7 mg/dL — ABNORMAL LOW (ref 8.9–10.3)
Chloride: 104 mmol/L (ref 98–111)
Creatinine, Ser: 0.91 mg/dL (ref 0.61–1.24)
GFR calc Af Amer: >60 mL/min (ref >60)
GFR calc non Af Amer: >60 mL/min (ref >60)
Glucose, Bld: 118 mg/dL — ABNORMAL HIGH (ref 70–99)
Potassium: 3.9 mmol/L (ref 3.5–5.1)
Sodium: 135 mmol/L (ref 135–145)

## 2019-03-24 MED ORDER — SODIUM CHLORIDE (PF) 0.9 % IJ SOLN
INTRAMUSCULAR | Status: AC
  Filled 2019-03-24: qty 50

## 2019-03-24 MED ORDER — DM-GUAIFENESIN ER 30-600 MG PO TB12: 1.0000 | ORAL_TABLET | Freq: Two times a day (BID) | ORAL | Status: DC | PRN

## 2019-03-24 MED ORDER — IOHEXOL 300 MG/ML  SOLN
100.0000 mL | Freq: Once | INTRAMUSCULAR | Status: AC | PRN
  Administered 2019-03-24: 100 mL via INTRAVENOUS

## 2019-03-24 MED ORDER — IOHEXOL 300 MG/ML  SOLN: 15.0000 mL | Freq: Two times a day (BID) | INTRAMUSCULAR | Status: DC

## 2019-03-24 MED ORDER — PIPERACILLIN-TAZOBACTAM 3.375 G IVPB
3.3750 g | Freq: Three times a day (TID) | INTRAVENOUS | Status: AC
  Administered 2019-03-24 – 2019-03-29 (×15): 3.375 g via INTRAVENOUS
  Filled 2019-03-24 (×15): qty 50

## 2019-03-24 MED ORDER — BISACODYL 10 MG RE SUPP
10.0000 mg | Freq: Once | RECTAL | Status: AC
  Administered 2019-03-24: 10 mg via RECTAL
  Filled 2019-03-24: qty 1

## 2019-03-24 NOTE — Progress Notes (Signed)
PT Cancellation Note  Patient Details Name: Omar Bell MRN: [id number] DOB: 04/30/1951   Cancelled Treatment:     am pt amb with nursing twice.  Went back to check on him in pm pt stated he was not able.  Trying to drink 2 bottles of contrast feeling very bloated and nausea.   [name]  PTA Acute  Rehabilitation Services Pager      [phone] Office      [phone]

## 2019-03-24 NOTE — TOC Initial Note (Signed)
Transition of Care [facility]) - Initial/Assessment Note    Patient Details  Name: Courtney Fenlon MRN: [id number] Date of Birth: 1951-08-04  Transition of Care [facility]) CM/SW Contact:    Lia Hopping, [place] Phone Number: 03/24/2019, 1:36 PM  Clinical Narrative:                 CSW met the patient at bedside to discuss care after discharge. Patient reports his daughter Percival Spanish has arranged to live with him until he can manage his own care again. Patient reports prior to admitting to hospital the patient reports he was fairly independent with ADL's and ambulating. CSW discuss physician recommendations for Home Health PT, patient agreeable. CSW actively seeking an agency in [place], [place]  that accepts [facility] insurance and has staff available to assist the patient.  CSW reached out to the patient daughter and inform her of the plan. CSW provided 5W contact information for daughter to receive medical updates.   Patient will DME: rollator.   Expected Discharge Plan: Home Health     Patient Goals and CMS Choice Patient states their goals for this hospitalization and ongoing recovery are:: "return home" CMS Medicare.gov Compare Post Acute Care list provided to:: Patient Choice offered to / list presented to : Patient  Expected Discharge Plan and Services Expected Discharge Plan: Home Health  In-house Referral: Clinical Social Work Discharge Planning Services: CM Consult Post Acute Care Choice: [place] arrangements for the past 2 months: Apartment                 DME Arranged: Agricultural consultant )   HH Arranged: PT   Prior Living Arrangements/Services Living arrangements for the past 2 months: Apartment Lives with:: Self Patient language and need for interpreter reviewed:: No Do you feel safe going back to the place where you live?: Yes      Need for Family Participation in Patient Care: Yes (Comment) Care giver support system in place?: Yes (comment)   Criminal Activity/Legal Involvement  Pertinent to Current Situation/Hospitalization: No - Comment as needed  Activities of Daily Living Home Assistive Devices/Equipment: Eyeglasses ADL Screening (condition at time of admission) Patient's cognitive ability adequate to safely complete daily activities?: Yes Is the patient deaf or have difficulty hearing?: No Does the patient have difficulty seeing, even when wearing glasses/contacts?: No Does the patient have difficulty concentrating, remembering, or making decisions?: No Patient able to express need for assistance with ADLs?: Yes Does the patient have difficulty dressing or bathing?: No Independently performs ADLs?: Yes (appropriate for developmental age) Does the patient have difficulty walking or climbing stairs?: No Weakness of Legs: None Weakness of Arms/Hands: None  Permission Sought/Granted Permission sought to share information with : Family Supports Permission granted to share information with : Yes, Verbal Permission Granted  Share Information with NAME: Arts development officer   Permission granted to share info w AGENCY: HomeHealth   Permission granted to share info w Relationship: Daughter      Emotional Assessment Appearance:: Appears stated age   Affect (typically observed): Accepting, Pleasant Orientation: : Oriented to Self, Oriented to Place, Oriented to  Time, Oriented to Situation Alcohol / Substance Use: Not Applicable Psych Involvement: No (comment)  Admission diagnosis:  Hernia Patient Active Problem List   Diagnosis Date Noted  . AKI (acute kidney injury) ([place]) 03/20/2019  . 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 Perforation of small intestine s/p SB resection 03/19/2019 03/19/2019  . SBO (small bowel obstruction)  from hernia 03/19/2019  . Personal history of noncompliance with medical treatment 03/19/2019  . Colonoscopy refused 03/19/2019  . Strangulated recurrent left inguinal hernia s/p lap repair  03/19/2019 03/18/2019  . Right inguinal hernia s/p lap repair 03/19/2019 03/18/2019  . Asthma 03/18/2019  . Nausea & vomiting 03/18/2019  . Hypokalemia 03/18/2019  . Dehydration 03/18/2019  . Hyperlipidemia   . Abdominal pain 11/12/2016   PCP:  Celene Squibb, MD Pharmacy:   [facility] [address] [place] [address] Phone: [phone] Fax: [phone]     Social Determinants of Health (SDOH) Interventions    Readmission Risk Interventions No flowsheet data found.

## 2019-03-24 NOTE — Progress Notes (Signed)
6 Days Post-Op   Subjective/Chief Complaint: No n/v, voiding fine since foley out, had couple smears of stool, no flatus, ambulating   Objective: Vital signs in last 24 hours: Temp:  [98.9 F (37.2 C)-99.4 F (37.4 C)] 99 F (37.2 C) (04/16 0434) Pulse Rate:  [90-96] 93 (04/16 0434) Resp:  [18-20] 18 (04/16 0434) BP: (111-135)/(57-87) 111/80 (04/16 0434) SpO2:  [96 %-97 %] 97 % (04/16 0434) Last BM Date: 03/23/19  Intake/Output from previous day: 04/15 0701 - 04/16 0700 In: 1907 [P.O.:240; I.V.:1506.8; IV Piggyback:160.2] Out: 2665 [Urine:2525; Drains:140] Intake/Output this shift: Total I/O In: 0  Out: 200 [Urine:200]  Gen: NAD, sitting in chair with sunglasses on again Heart: rrr Lungs: clear bilaterally Abd: soft, less distention incisions c/d/i. JP with minimal serosang output Ext: no pain in BLE, no edema, erythema, or warmth Psych: A&O x4  Lab Results:  Recent Labs    03/23/19 0509 03/24/19 0403  WBC 10.9* 13.8*  HGB 10.5* 11.2*  HCT 33.6* 34.9*  PLT 177 205   BMET Recent Labs    03/23/19 0509 03/24/19 0403  NA 134* 135  K 3.8 3.9  CL 103 104  CO2 24 22  GLUCOSE 118* 118*  BUN 22 24*  CREATININE 0.87 0.91  CALCIUM 7.5* 7.7*   PT/INR No results for input(s): LABPROT, INR in the last 72 hours. ABG No results for input(s): PHART, HCO3 in the last 72 hours.  Invalid input(s): PCO2, PO2  Studies/Results: Dg Abd Portable 1v  Result Date: 03/23/2019 CLINICAL DATA:  Postoperative ileus. EXAM: PORTABLE ABDOMEN - 1 VIEW COMPARISON:  Radiographs of March 21, 2019. FINDINGS: The bowel gas pattern is normal. No radio-opaque calculi or other significant radiographic abnormality are seen. Drainage catheter is noted in the pelvis. IMPRESSION: No evidence of bowel obstruction or ileus. Electronically Signed   By: Lupita RaiderJames  Green Jr M.D.   On: 03/23/2019 13:37    Anti-infectives: Anti-infectives (From admission, onward)   Start     Dose/Rate Route Frequency  Ordered Stop   03/24/19 1130  piperacillin-tazobactam (ZOSYN) IVPB 3.375 g     3.375 g 12.5 mL/hr over 240 Minutes Intravenous Every 8 hours 03/24/19 1120 03/29/19 1129   03/19/19 0200  piperacillin-tazobactam (ZOSYN) IVPB 3.375 g  Status:  Discontinued     3.375 g 12.5 mL/hr over 240 Minutes Intravenous Every 8 hours 03/19/19 0028 03/19/19 0036   03/19/19 0200  piperacillin-tazobactam (ZOSYN) IVPB 3.375 g     3.375 g 12.5 mL/hr over 240 Minutes Intravenous Every 8 hours 03/19/19 0036 03/24/19 0659   03/18/19 2038  clindamycin (CLEOCIN) 900 mg, gentamicin (GARAMYCIN) 240 mg in sodium chloride 0.9 % 1,000 mL for intraperitoneal lavage  Status:  Discontinued       As needed 03/18/19 2038 03/18/19 2253   03/18/19 1845  ceFAZolin (ANCEF) IVPB 2g/100 mL premix     2 g 200 mL/hr over 30 Minutes Intravenous On call to O.R. 03/18/19 1747 03/18/19 1953   03/18/19 1845  metroNIDAZOLE (FLAGYL) IVPB 500 mg     500 mg 100 mL/hr over 60 Minutes Intravenous On call to O.R. 03/18/19 1747 03/18/19 1953   03/18/19 1830  clindamycin (CLEOCIN) 900 mg, gentamicin (GARAMYCIN) 240 mg in sodium chloride 0.9 % 1,000 mL for intraperitoneal lavage  Status:  Discontinued    Note to Pharmacy:  Have in the  OR room for final irrigation in bowel surgery case to minimize risk of abscess/infection Pharmacy may adjust dosing strength, schedule, rate of infusion, etc  as needed to optimize therapy    Irrigation To Surgery 03/18/19 1828 03/19/19 0002   03/18/19 1806  metroNIDAZOLE (FLAGYL) 5-0.79 MG/ML-% IVPB    Note to Pharmacy:  [name]   : cabinet override      03/18/19 1806 03/18/19 1923   03/18/19 1806  ceFAZolin (ANCEF) 2-4 GM/100ML-% IVPB    Note to Pharmacy:  [name]   : cabinet override      03/18/19 1806 03/18/19 1923      Assessment/Plan: POD 6 s/p laparoscopic BIH repairs with absorbable Phasix mesh with SBR for recurrent incarcerated LIH with gangrenous perforation of jejunum and SBO as well  as right femoral hernia -having a little bowel function but still with ileus, wbc increasing, high risk for abscess, will get ct ab/pelvis  -mobilize and pulm toilet.  -cont JP drain for now and follow, will remove prior to dc -PT eval for mobilization, note recs for home health -will continue zosyn for now until we get ct scan back ARF- resolved, Cr0.91 Tachycardia - resolved tachypnea/dyspnea-echo and duplex fine, this is better today,  AMS -resolved Hypokalemia-3.9 today, will check BMET in am again FEN -remain on ice chips, hopefully increase diet soon as appears ileus resolving VTE -Lovenox, SCDs ID -Zosyn I discussed case with his daughter Byrd Hesselbach again today  Emelia Loron 03/24/2019

## 2019-03-24 NOTE — Progress Notes (Signed)
PROGRESS NOTE    Omar Bell  ZOX:[id number] DOB: 24-May-1951 DOA: 03/18/2019 PCP: Benita Stabile, MD   Brief Narrative:  Omar Bell an 68 y.o.malewith prior h/o hyperlipidemia, initially presented to ED with incarcerated leftinguinal hernia perforation of the jejunumcausingSBO, underwent laparoscopiclysis of adhesions, small bowel resection, lap bil inguinal and right femoral hernia repairs with absorbable mesh,by surgery on 03/18/2019.  Medical team was consulted for confusion, tachycardia tachypnea.   Assessment & Plan   Acute metabolic encephalopathy -resolved, appears to be AAOx3 -Ammonia, TSH and vitamin B12 within normal limits -Given that no neuro deficits, and encephalopathy appears to resolved, no indication for CT of the head at this time  Tachycardia and tachypnea-likely acute diastolic heart failure -Chest x-ray showed possible left lower lobe consolidation with bilateral pleural effusion -Likely secondary to fluid overload given significant positive fluid balance -Currently afebrile with no leukocytosis -Tachycardia has improved -Continue nebulizer treatments as needed -Patient did receive IV Lasix -Continue to monitor intake and output, daily weights -Echocardiogram EF 60 to 65%, LV diastolic Doppler parameters are consistent with impaired relaxation -Extremity Doppler unremarkable for DVT  Leukocytosis -discussed with Dr. Dwain Sarna, surgery, will obtain CT scan to assess for abscess -Continue to monitor CBC  Hypokalemia -Potassium currently 3.8, continue to monitor BMP  Incarcerated left inguinal hernia with gangrenous perforation of the jejunum and SBO -Per general surgery  Acute anemia of blood loss -Secondary to surgery -Hemoglobin currently 11.2, was 14.80 on admission however suspect this is dilutional    -Continue to monitor CBC, transfuse if hemoglobin below 7  Acute kidney injury -Resolved, continue to monitor BMP  Right bundle  branch block -No complaints of chest pain, troponin negative on admission x2 right bundle branch block   DVT Prophylaxis  Lovenox  Code Status: Full  Family Communication: None at bedside  Disposition Plan: Admitted. 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 as well as right femoral hernia  Echocardiogram  Lower extremity doppler  Antibiotics   Anti-infectives (From admission, onward)   Start     Dose/Rate Route Frequency Ordered Stop   03/19/19 0200  piperacillin-tazobactam (ZOSYN) IVPB 3.375 g  Status:  Discontinued     3.375 g 12.5 mL/hr over 240 Minutes Intravenous Every 8 hours 03/19/19 0028 03/19/19 0036   03/19/19 0200  piperacillin-tazobactam (ZOSYN) IVPB 3.375 g     3.375 g 12.5 mL/hr over 240 Minutes Intravenous Every 8 hours 03/19/19 0036 03/24/19 0659   03/18/19 2038  clindamycin (CLEOCIN) 900 mg, gentamicin (GARAMYCIN) 240 mg in sodium chloride 0.9 % 1,000 mL for intraperitoneal lavage  Status:  Discontinued       As needed 03/18/19 2038 03/18/19 2253   03/18/19 1845  ceFAZolin (ANCEF) IVPB 2g/100 mL premix     2 g 200 mL/hr over 30 Minutes Intravenous On call to O.R. 03/18/19 1747 03/18/19 1953   03/18/19 1845  metroNIDAZOLE (FLAGYL) IVPB 500 mg     500 mg 100 mL/hr over 60 Minutes Intravenous On call to O.R. 03/18/19 1747 03/18/19 1953   03/18/19 1830  clindamycin (CLEOCIN) 900 mg, gentamicin (GARAMYCIN) 240 mg in sodium chloride 0.9 % 1,000 mL for intraperitoneal lavage  Status:  Discontinued    Note to Pharmacy:  Have in the  OR room for final irrigation in bowel surgery case to minimize risk of abscess/infection Pharmacy may adjust dosing strength, schedule, rate  of infusion, etc as needed to optimize therapy    Irrigation To Surgery 03/18/19 1828 03/19/19 0002   03/18/19 1806  metroNIDAZOLE (FLAGYL) 5-0.79  MG/ML-% IVPB    Note to Pharmacy:  [name]   : cabinet override      03/18/19 1806 03/18/19 1923   03/18/19 1806  ceFAZolin (ANCEF) 2-4 GM/100ML-% IVPB    Note to Pharmacy:  [name]   : cabinet override      03/18/19 1806 03/18/19 1923      Subjective:   Omar Bell seen and examined today.  Patient complains of being woken up this morning at 4 AM for blood draws.  Currently has no complaints of chest pain, shortness of breath, dizziness or headache.  Wants to know if he will be able to eat or drink more today.  Objective:   Vitals:   03/23/19 1349 03/23/19 1444 03/23/19 2025 03/24/19 0434  BP: 135/87  (!) 119/57 111/80  Pulse: 93 90 96 93  Resp: 20  20 18   Temp: 98.9 F (37.2 C)  99.4 F (37.4 C) 99 F (37.2 C)  TempSrc: Oral  Oral Oral  SpO2: 97% 96% 97% 97%  Weight:      Height:        Intake/Output Summary (Last 24 hours) at 03/24/2019 1001 Last data filed at 03/24/2019 0826 Gross per 24 hour  Intake 1382.38 ml  Output 1865 ml  Net -482.62 ml   Filed Weights   03/18/19 1534  Weight: 72.6 kg   Exam  General: Well developed, chronically ill appearing, NAD  HEENT: NCAT, mucous membranes moist.   Neck: Supple  Cardiovascular: S1 S2 auscultated, RRR  Respiratory: Clear to auscultation bilaterally   Abdomen: Soft, appropriately TTP, JP drain in place  Extremities: warm dry without cyanosis clubbing or edema  Neuro: AAOx3, nonfocal  Data Reviewed: I have personally reviewed following labs and imaging studies  CBC: Recent Labs  Lab 03/20/19 0407 03/21/19 0314 03/22/19 0301 03/23/19 0509 03/24/19 0403  WBC 7.8 8.6 10.5 10.9* 13.8*  HGB 12.2* 10.8* 10.2* 10.5* 11.2*  HCT 37.7* 33.6* 32.7* 33.6* 34.9*  MCV 94.7 95.5 95.9 96.6 95.4  PLT 173 154 145* 177 205   Basic Metabolic Panel: Recent Labs  Lab 03/18/19 2318 03/19/19 0850 03/20/19 0407 03/21/19 0314 03/22/19 0301 03/23/19 0509 03/24/19 0403  NA  --  140 142 140 137  134* 135  K  --  2.4* 3.1* 3.3* 3.7 3.8 3.9  CL  --  105 105 107 107 103 104  CO2  --  25 24 23 23 24 22   GLUCOSE  --  122* 107* 113* 126* 118* 118*  BUN  --  25* 26* 22 20 22  24*  CREATININE  --  1.24 1.39* 1.05 0.92 0.87 0.91  CALCIUM  --  6.8* 6.7* 7.2* 7.4* 7.5* 7.7*  MG 2.7* 2.2 2.4  --   --  2.1  --    GFR: Estimated Creatinine Clearance: 76.6 mL/min (by C-G formula based on SCr of 0.91 mg/dL). Liver Function Tests: Recent Labs  Lab 03/18/19 1601  AST 29  ALT 33  ALKPHOS 65  BILITOT 2.3*  PROT 9.1*  ALBUMIN 4.6   Recent Labs  Lab 03/18/19 1601  LIPASE 24   Recent Labs  Lab 03/21/19 1401  AMMONIA 21   Coagulation Profile: No results for input(s): INR, PROTIME in the last 168 hours. Cardiac Enzymes: Recent Labs  Lab 03/18/19 2318 03/19/19 0246  TROPONINI  0.03* <0.03   BNP (last 3 results) No results for input(s): PROBNP in the last 8760 hours. HbA1C: No results for input(s): HGBA1C in the last 72 hours. CBG: No results for input(s): GLUCAP in the last 168 hours. Lipid Profile: No results for input(s): CHOL, HDL, LDLCALC, TRIG, CHOLHDL, LDLDIRECT in the last 72 hours. Thyroid Function Tests: Recent Labs    03/22/19 0301  TSH 4.262   Anemia Panel: Recent Labs    03/22/19 0301  VITAMINB12 728   Urine analysis: No results found for: COLORURINE, APPEARANCEUR, LABSPEC, PHURINE, GLUCOSEU, HGBUR, BILIRUBINUR, KETONESUR, PROTEINUR, UROBILINOGEN, NITRITE, LEUKOCYTESUR Sepsis Labs: (procalcitonin:4,lacticidven:4)  )No results found for this or any previous visit (from the past 240 hour(s)).    Radiology Studies: Dg Abd Portable 1v  Result Date: 03/23/2019 CLINICAL DATA:  Postoperative ileus. EXAM: PORTABLE ABDOMEN - 1 VIEW COMPARISON:  Radiographs of March 21, 2019. FINDINGS: The bowel gas pattern is normal. No radio-opaque calculi or other significant radiographic abnormality are seen. Drainage catheter is noted in the pelvis. IMPRESSION:  No evidence of bowel obstruction or ileus. Electronically Signed   By: Lupita Raider M.D.   On: 03/23/2019 13:37   Vas [place] Lower Extremity Venous (dvt)  Result Date: 03/22/2019  Lower Venous Study Indications: Edema.  Performing Technologist: Chanda Busing RVT  Examination Guidelines: A complete evaluation includes B-mode imaging, spectral Doppler, color Doppler, and power Doppler as needed of all accessible portions of each vessel. Bilateral testing is considered an integral part of a complete examination. Limited examinations for reoccurring indications may be performed as noted.  +---------+---------------+---------+-----------+----------+-------+  RIGHT     Compressibility Phasicity Spontaneity Properties Summary  +---------+---------------+---------+-----------+----------+-------+  CFV       Full            Yes       Yes                             +---------+---------------+---------+-----------+----------+-------+  SFJ       Full                                                      +---------+---------------+---------+-----------+----------+-------+  FV Prox   Full                                                      +---------+---------------+---------+-----------+----------+-------+  FV Mid    Full                                                      +---------+---------------+---------+-----------+----------+-------+  FV Distal Full                                                      +---------+---------------+---------+-----------+----------+-------+  PFV       Full                                                      +---------+---------------+---------+-----------+----------+-------+  POP       Full            Yes       Yes                             +---------+---------------+---------+-----------+----------+-------+  PTV       Full                                                      +---------+---------------+---------+-----------+----------+-------+  PERO      Full                                                       +---------+---------------+---------+-----------+----------+-------+   +---------+---------------+---------+-----------+----------+-------+  LEFT      Compressibility Phasicity Spontaneity Properties Summary  +---------+---------------+---------+-----------+----------+-------+  CFV       Full            Yes       Yes                             +---------+---------------+---------+-----------+----------+-------+  SFJ       Full                                                      +---------+---------------+---------+-----------+----------+-------+  FV Prox   Full                                                      +---------+---------------+---------+-----------+----------+-------+  FV Mid    Full                                                      +---------+---------------+---------+-----------+----------+-------+  FV Distal Full                                                      +---------+---------------+---------+-----------+----------+-------+  PFV       Full                                                      +---------+---------------+---------+-----------+----------+-------+  POP       Full            Yes       Yes                             +---------+---------------+---------+-----------+----------+-------+  PTV       Full                                                      +---------+---------------+---------+-----------+----------+-------+  PERO      Full                                                      +---------+---------------+---------+-----------+----------+-------+     Summary: Right: There is no evidence of deep vein thrombosis in the lower extremity. No cystic structure found in the popliteal fossa. Left: There is no evidence of deep vein thrombosis in the lower extremity. No cystic structure found in the popliteal fossa.  *See table(s) above for measurements and observations. Electronically signed by Coral ElseVance Brabham MD on 03/22/2019 at 12:32:30 PM.    Final       Scheduled Meds:  dextromethorphan-guaiFENesin  1 tablet Oral BID   enoxaparin (LOVENOX) injection  40 mg Subcutaneous Q24H   lip balm  1 application Topical BID   metoprolol tartrate  2.5 mg Intravenous Q8H   Continuous Infusions:  dextrose 5 % and 0.45 % NaCl with KCl 20 mEq/L 50 mL/hr at 03/24/19 0600   methocarbamol (ROBAXIN) IV     ondansetron (ZOFRAN) IV       LOS: 6 days   Time Spent in minutes   30 minutes  Anival Pasha D.O. on 03/24/2019 at 10:01 AM  Between 7am to 7pm - Please see pager noted on [email or web address]  After 7pm go to [email or web address]  And look for the night coverage person covering for me after hours  [facility] Office  [phone]

## 2019-03-24 NOTE — Progress Notes (Signed)
Brief pharmacy TPN note:  Plan and Assessment:  -Pharmacy consulted to start TPN on 4/17, currently pt doesn't have PICC and one is being placed tomorrow - place order for dietitian evaluation - TPN labs for tomorrow morning  Arley Phenix RPh 03/24/2019, 5:18 PM Pager [phone]

## 2019-03-24 NOTE — Progress Notes (Deleted)
RN presented to room at 1855 after patient call.   Patient stated "im having chest pain, right here on my sternum, it hurts"  VS obtained, see flowsheet (VS at 1908/ completed at 1858).   Patient denied worsening shortness of breath, any GI/reflux symptoms.   EKG also started while paging attending MD.   EKg completed.   MD returned RN page at 1859.   Ordered troponin x1, and a medication to help with coughing symptoms.  MD stated she would inform night on call MD of patient events.     Night RN  given report about patient events and MD actions.    RN placed disposable heat pack on patient's sternum to see if this would provide some relief.    Night RN accepted reassessments of patient status.    

## 2019-03-24 NOTE — Care Management Important Message (Signed)
Important Message  Patient Details  Name: Omar Bell MRN: [id number] Date of Birth: 11/04/1951   Medicare Important Message Given:  Yes    Caren Macadam 03/24/2019, 12:24 PMImportant Message  Patient Details  Name: Omar Bell MRN: [id number] Date of Birth: Apr 19, 1951   Medicare Important Message Given:  Yes    Caren Macadam 03/24/2019, 12:24 PM

## 2019-03-25 LAB — COMPREHENSIVE METABOLIC PANEL
ALT: 27 U/L (ref 0–44)
AST: 34 U/L (ref 15–41)
Albumin: 2 g/dL — ABNORMAL LOW (ref 3.5–5.0)
Alkaline Phosphatase: 138 U/L — ABNORMAL HIGH (ref 38–126)
Anion gap: 9 (ref 5–15)
BUN: 19 mg/dL (ref 8–23)
CO2: 21 mmol/L — ABNORMAL LOW (ref 22–32)
Calcium: 7.6 mg/dL — ABNORMAL LOW (ref 8.9–10.3)
Chloride: 104 mmol/L (ref 98–111)
Creatinine, Ser: 0.88 mg/dL (ref 0.61–1.24)
GFR calc Af Amer: >60 mL/min (ref >60)
GFR calc non Af Amer: >60 mL/min (ref >60)
Glucose, Bld: 119 mg/dL — ABNORMAL HIGH (ref 70–99)
Potassium: 3.5 mmol/L (ref 3.5–5.1)
Sodium: 134 mmol/L — ABNORMAL LOW (ref 135–145)
Total Bilirubin: 1.2 mg/dL (ref 0.3–1.2)
Total Protein: 5.6 g/dL — ABNORMAL LOW (ref 6.5–8.1)

## 2019-03-25 LAB — DIFFERENTIAL
Abs Immature Granulocytes: 2.29 10*3/uL — ABNORMAL HIGH (ref 0.00–0.07)
Basophils Absolute: 0.1 10*3/uL (ref 0.0–0.1)
Basophils Relative: 1 %
Eosinophils Absolute: 0.1 10*3/uL (ref 0.0–0.5)
Eosinophils Relative: 1 %
Immature Granulocytes: 15 %
Lymphocytes Relative: 6 %
Lymphs Abs: 1 10*3/uL (ref 0.7–4.0)
Monocytes Absolute: 0.7 10*3/uL (ref 0.1–1.0)
Monocytes Relative: 4 %
Neutro Abs: 11.5 10*3/uL — ABNORMAL HIGH (ref 1.7–7.7)
Neutrophils Relative %: 73 %

## 2019-03-25 LAB — CBC
HCT: 34.2 % — ABNORMAL LOW (ref 39.0–52.0)
Hemoglobin: 10.8 g/dL — ABNORMAL LOW (ref 13.0–17.0)
MCH: 30.3 pg (ref 26.0–34.0)
MCHC: 31.6 g/dL (ref 30.0–36.0)
MCV: 95.8 fL (ref 80.0–100.0)
Platelets: 323 10*3/uL (ref 150–400)
RBC: 3.57 MIL/uL — ABNORMAL LOW (ref 4.22–5.81)
RDW: 13.2 % (ref 11.5–15.5)
WBC: 15.6 10*3/uL — ABNORMAL HIGH (ref 4.0–10.5)
nRBC: 0 % (ref 0.0–0.2)

## 2019-03-25 LAB — PREALBUMIN: Prealbumin: 7.4 mg/dL — ABNORMAL LOW (ref 18–38)

## 2019-03-25 LAB — PHOSPHORUS: Phosphorus: 2.4 mg/dL — ABNORMAL LOW (ref 2.5–4.6)

## 2019-03-25 LAB — TRIGLYCERIDES: Triglycerides: 496 mg/dL — ABNORMAL HIGH (ref <150)

## 2019-03-25 LAB — GLUCOSE, CAPILLARY: Glucose-Capillary: 136 mg/dL — ABNORMAL HIGH (ref 70–99)

## 2019-03-25 LAB — MAGNESIUM: Magnesium: 1.9 mg/dL (ref 1.7–2.4)

## 2019-03-25 MED ORDER — KCL IN DEXTROSE-NACL 20-5-0.45 MEQ/L-%-% IV SOLN
INTRAVENOUS | Status: DC
  Administered 2019-03-26: 07:00:00 via INTRAVENOUS
  Filled 2019-03-25 (×2): qty 1000

## 2019-03-25 MED ORDER — POTASSIUM CHLORIDE 10 MEQ/100ML IV SOLN
10.0000 meq | INTRAVENOUS | Status: AC
  Administered 2019-03-25 (×3): 10 meq via INTRAVENOUS
  Filled 2019-03-25 (×2): qty 100

## 2019-03-25 MED ORDER — TRAVASOL 10 % IV SOLN: INTRAVENOUS | Status: DC

## 2019-03-25 MED ORDER — POTASSIUM PHOSPHATES 15 MMOLE/5ML IV SOLN
15.0000 mmol | Freq: Once | INTRAVENOUS | Status: AC
  Administered 2019-03-25: 09:00:00 15 mmol via INTRAVENOUS
  Filled 2019-03-25: qty 5

## 2019-03-25 MED ORDER — TRAVASOL 10 % IV SOLN
INTRAVENOUS | Status: AC
  Administered 2019-03-25: 18:00:00 via INTRAVENOUS
  Filled 2019-03-25: qty 528

## 2019-03-25 MED ORDER — SODIUM CHLORIDE 0.9% FLUSH
10.0000 mL | Freq: Two times a day (BID) | INTRAVENOUS | Status: DC
  Administered 2019-03-25 – 2019-04-04 (×13): 10 mL

## 2019-03-25 MED ORDER — POTASSIUM CHLORIDE 10 MEQ/100ML IV SOLN
INTRAVENOUS | Status: AC
  Filled 2019-03-25: qty 100

## 2019-03-25 MED ORDER — SODIUM CHLORIDE 0.9% FLUSH
10.0000 mL | INTRAVENOUS | Status: DC | PRN
  Administered 2019-03-28 – 2019-03-31 (×2): 10 mL
  Filled 2019-03-25 (×2): qty 40

## 2019-03-25 NOTE — Progress Notes (Signed)
PROGRESS NOTE    Lucienne Capersnthony Bell  ZOX:[id number] DOB: Oct 06, 1951 DOA: 03/18/2019 PCP: [name], [name], MD   Brief Narrative:  [name] an 68 y.o.malewith prior h/o hyperlipidemia, initially presented to ED with incarcerated leftinguinal hernia perforation of the jejunumcausingSBO, underwent laparoscopiclysis of adhesions, small bowel resection, lap bil inguinal and right femoral hernia repairs with absorbable mesh,by surgery on 03/18/2019.  Medical team was consulted for confusion, tachycardia tachypnea.   Assessment & Plan   Acute metabolic encephalopathy -resolved, appears to be AAOx3 -Ammonia, TSH and vitamin B12 within normal limits -Given that no neuro deficits, and encephalopathy appears to resolved, no indication for CT of the head at this time  Tachycardia and tachypnea-likely acute diastolic heart failure -Chest x-ray showed possible left lower lobe consolidation with bilateral pleural effusion -Likely secondary to fluid overload given significant positive fluid balance -Currently afebrile with no leukocytosis -Tachycardia has improved -Continue nebulizer treatments as needed -Patient did receive IV Lasix -Continue to monitor intake and output, daily weights -Echocardiogram EF 60 to 65%, LV diastolic Doppler parameters are consistent with impaired relaxation -Extremity Doppler unremarkable for DVT  Leukocytosis -Continues to increase, however patient currently on Zosyn -CT abdomen pelvis showed possible developing abscess -General surgery discussed with interventional radiology, currently not drainable at this time.  Will likely need repeat CT scan in 3 to 4 days if not improved.  Hypokalemia -Continue to monitor BMP and replace as needed  Incarcerated left inguinal hernia with gangrenous perforation of the jejunum and SBO -Per general surgery  Acute anemia of blood loss -Secondary to surgery -Hemoglobin currently 10.8, was 14.80 on admission however  suspect this is dilutional    -Continue to monitor CBC, transfuse if hemoglobin below 7  Acute kidney injury -Resolved, continue to monitor BMP  Right bundle branch block -No complaints of chest pain, troponin negative on admission x2 right bundle branch block   DVT Prophylaxis  Lovenox   Code Status: Full  Family Communication: None at bedside  Disposition Plan: Admitted. 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 as well as right femoral hernia  Echocardiogram  Lower extremity doppler  Antibiotics   Anti-infectives (From admission, onward)   Start     Dose/Rate Route Frequency Ordered Stop   03/24/19 1200  piperacillin-tazobactam (ZOSYN) IVPB 3.375 g     3.375 g 12.5 mL/hr over 240 Minutes Intravenous Every 8 hours 03/24/19 1120 03/29/19 1359   03/19/19 0200  piperacillin-tazobactam (ZOSYN) IVPB 3.375 g  Status:  Discontinued     3.375 g 12.5 mL/hr over 240 Minutes Intravenous Every 8 hours 03/19/19 0028 03/19/19 0036   03/19/19 0200  piperacillin-tazobactam (ZOSYN) IVPB 3.375 g     3.375 g 12.5 mL/hr over 240 Minutes Intravenous Every 8 hours 03/19/19 0036 03/24/19 0659   03/18/19 2038  clindamycin (CLEOCIN) 900 mg, gentamicin (GARAMYCIN) 240 mg in sodium chloride 0.9 % 1,000 mL for intraperitoneal lavage  Status:  Discontinued       As needed 03/18/19 2038 03/18/19 2253   03/18/19 1845  ceFAZolin (ANCEF) IVPB 2g/100 mL premix     2 g 200 mL/hr over 30 Minutes Intravenous On call to O.R. 03/18/19 1747 03/18/19 1953   03/18/19 1845  metroNIDAZOLE (FLAGYL) IVPB 500 mg     500 mg 100 mL/hr over 60 Minutes Intravenous On call to O.R. 03/18/19 1747 03/18/19 1953   03/18/19  1830  clindamycin (CLEOCIN) 900 mg, gentamicin (GARAMYCIN) 240 mg in sodium chloride 0.9 % 1,000 mL for intraperitoneal lavage  Status:  Discontinued     Note to Pharmacy:  Have in the  OR room for final irrigation in bowel surgery case to minimize risk of abscess/infection Pharmacy may adjust dosing strength, schedule, rate of infusion, etc as needed to optimize therapy    Irrigation To Surgery 03/18/19 1828 03/19/19 0002   03/18/19 1806  metroNIDAZOLE (FLAGYL) 5-0.79 MG/ML-% IVPB    Note to Pharmacy:  [name]   : cabinet override      03/18/19 1806 03/18/19 1923   03/18/19 1806  ceFAZolin (ANCEF) 2-4 GM/100ML-% IVPB    Note to Pharmacy:  [name]   : cabinet override      03/18/19 1806 03/18/19 1923      Subjective:   Omar Bell seen and examined today.  Sitting up in chair.  Able to have some gas passage as well as bowel movement.  Complains of intermittent hiccups today.  Denies chest pain, shortness of breath. Objective:   Vitals:   03/24/19 1342 03/24/19 2000 03/24/19 2045 03/25/19 0538  BP: 119/76  129/83 105/69  Pulse: 88 100 (!) 103 92  Resp: 18 20 (!) 24 20  Temp: 98.6 F (37 C)  98.9 F (37.2 C) 98.4 F (36.9 C)  TempSrc: Oral  Oral Oral  SpO2: 98%  97% 97%  Weight:      Height:        Intake/Output Summary (Last 24 hours) at 03/25/2019 1152 Last data filed at 03/25/2019 1142 Gross per 24 hour  Intake 1339.54 ml  Output 1236 ml  Net 103.54 ml   Filed Weights   03/18/19 1534  Weight: 72.6 kg   Exam  General: Well developed, chronically ill-appearing, NAD  HEENT: NCAT,mucous membranes moist.   Neuro: AAOx3, nonfocal  Psych: Appropriate  Data Reviewed: I have personally reviewed following labs and imaging studies  CBC: Recent Labs  Lab 03/21/19 0314 03/22/19 0301 03/23/19 0509 03/24/19 0403 03/25/19 0608  WBC 8.6 10.5 10.9* 13.8* 15.6*  NEUTROABS  --   --   --   --  11.5*  HGB 10.8* 10.2* 10.5* 11.2* 10.8*  HCT 33.6* 32.7* 33.6* 34.9* 34.2*  MCV 95.5 95.9 96.6 95.4 95.8  PLT 154 145* 177 205 323   Basic Metabolic Panel: Recent Labs  Lab 03/18/19 2318 03/19/19 0850  03/20/19 0407 03/21/19 0314 03/22/19 0301 03/23/19 0509 03/24/19 0403 03/25/19 0608  NA  --  140 142 140 137 134* 135 134*  K  --  2.4* 3.1* 3.3* 3.7 3.8 3.9 3.5  CL  --  105 105 107 107 103 104 104  CO2  --  21*  GLUCOSE  --  122* 107* 113* 126* 118* 118* 119*  BUN  --  25* 26* 24* 19  CREATININE  --  1.24 1.39* 1.05 0.92 0.87 0.91 0.88  CALCIUM  --  6.8* 6.7* 7.2* 7.4* 7.5* 7.7* 7.6*  MG 2.7* 2.2 2.4  --   --  2.1  --  1.9  PHOS  --   --   --   --   --   --   --  2.4*   GFR: Estimated Creatinine Clearance: 79.2 mL/min (by C-G formula based on SCr of 0.88 mg/dL). Liver Function Tests: Recent Labs  Lab 03/18/19 1601 03/25/19 0608  AST 29 34  ALT  33 27  ALKPHOS 65 138*  BILITOT 2.3* 1.2  PROT 9.1* 5.6*  ALBUMIN 4.6 2.0*   Recent Labs  Lab 03/18/19 1601  LIPASE 24   Recent Labs  Lab 03/21/19 1401  AMMONIA 21   Coagulation Profile: No results for input(s): INR, PROTIME in the last 168 hours. Cardiac Enzymes: Recent Labs  Lab 03/18/19 2318 03/19/19 0246  TROPONINI 0.03* <0.03   BNP (last 3 results) No results for input(s): PROBNP in the last 8760 hours. HbA1C: No results for input(s): HGBA1C in the last 72 hours. CBG: No results for input(s): GLUCAP in the last 168 hours. Lipid Profile: Recent Labs    03/25/19 0608  TRIG 496*   Thyroid Function Tests: No results for input(s): TSH, T4TOTAL, FREET4, T3FREE, THYROIDAB in the last 72 hours. Anemia Panel: No results for input(s): VITAMINB12, FOLATE, FERRITIN, TIBC, IRON, RETICCTPCT in the last 72 hours. Urine analysis: No results found for: COLORURINE, APPEARANCEUR, LABSPEC, PHURINE, GLUCOSEU, HGBUR, BILIRUBINUR, KETONESUR, PROTEINUR, UROBILINOGEN, NITRITE, LEUKOCYTESUR Sepsis Labs: (procalcitonin:4,lacticidven:4)  )No results found for this or any previous visit (from the past 240 hour(s)).    Radiology Studies: Ct Abdomen Pelvis W Contrast  Result Date:  03/24/2019 CLINICAL DATA:  Status post incarcerated left inguinal hernia repair, jejunal perforation, small-bowel obstruction EXAM: CT ABDOMEN AND PELVIS WITH CONTRAST TECHNIQUE: Multidetector CT imaging of the abdomen and pelvis was performed using the standard protocol following bolus administration of intravenous contrast. CONTRAST:  OMNIPAQUE IOHEXOL 300 MG/ML  SOLN COMPARISON:  None. FINDINGS: Lower chest: Small to moderate bilateral pleural effusions associated atelectasis or consolidation. Three-vessel coronary artery calcifications. Hepatobiliary: No focal liver abnormality is seen. No gallstones, gallbladder wall thickening, or biliary dilatation. Pancreas: Unremarkable. No pancreatic ductal dilatation or surrounding inflammatory changes. Spleen: Normal in size without focal abnormality. Adrenals/Urinary Tract: Adrenal glands are unremarkable. Kidneys are normal, without renal calculi, focal lesion, or hydronephrosis. Bladder is unremarkable. Stomach/Bowel: Stomach is within normal limits. Appendix appears normal. Status post proximal small bowel resection in the left hemiabdomen. There is extensive inflammatory thickening involving the proximal and mid small bowel in the left hemiabdomen as well as the cecum. There are multiple small collections of fluid about the bowel loops and mesentery in the left hemiabdomen (series 2, image 48, 54, 61). The most suspicious fluid collection contains small locules of air and is located posteriorly in the left hemiabdomen, measuring approximately 3.6 cm, and may be a developing abscess (series 2, image 54). Vascular/Lymphatic: Mixed calcific atherosclerosis. No enlarged abdominal or pelvic lymph nodes. Reproductive: No mass or other abnormality. Other: Fluid in the inguinal canals bilaterally. Tunneled surgical drain is looped about the central low abdomen. No abdominopelvic ascites. Musculoskeletal: No acute or significant osseous findings. IMPRESSION: 1. Status  post proximal small bowel resection in the left hemiabdomen. There is extensive inflammatory thickening involving the proximal and mid small bowel in the left hemiabdomen as well as the cecum, nonspecific in the recent postoperative setting. 2. There are multiple small collections of fluid about the bowel loops and mesentery in the left hemiabdomen (series 2, image 48, 54, 61), nonspecific in the setting of recent surgery. The most suspicious fluid collection contains small locules of air and is located posteriorly in the left hemiabdomen, measuring approximately 3.6 cm, and may be a developing abscess (series 2, image 54). 3.  Other findings as above. Electronically Signed   By: Lauralyn Primes M.D.   On: 03/24/2019 16:31   [place] Ekg Site Rite  Result Date: 03/24/2019  If [employer] not attached, placement could not be confirmed due to current cardiac rhythm.    Scheduled Meds:  enoxaparin (LOVENOX) injection  40 mg Subcutaneous Q24H   lip balm  1 application Topical BID   metoprolol tartrate  2.5 mg Intravenous Q8H   sodium chloride flush  10-40 mL Intracatheter Q12H   Continuous Infusions:  dextrose 5 % and 0.45 % NaCl with KCl 20 mEq/L 50 mL/hr at 03/25/19 0602   dextrose 5 % and 0.45 % NaCl with KCl 20 mEq/L     methocarbamol (ROBAXIN) IV     ondansetron (ZOFRAN) IV     piperacillin-tazobactam (ZOSYN)  IV 3.375 g (03/25/19 0602)   potassium chloride 10 mEq (03/25/19 1142)   potassium PHOSPHATE IVPB (in mmol) 15 mmol (03/25/19 0917)   TPN ADULT (ION)       LOS: 7 days   Time Spent in minutes   15 minutes  Alexsia Klindt D.O. on 03/25/2019 at 11:52 AM  Between 7am to 7pm - Please see pager noted on [email or web address]  After 7pm go to [email or web address]  And look for the night coverage person covering for me after hours  [facility] Office  [phone]

## 2019-03-25 NOTE — Progress Notes (Signed)
PT Cancellation Note  Patient Details Name: Omar Bell MRN: [id number] DOB: July 23, 1951   Cancelled Treatment:     attempted twice.  AM PICC line placed                                                                         PM declined requesting to rest   [name]  PTA Acute  Rehabilitation Services Pager      [phone] Office      [phone]

## 2019-03-25 NOTE — Progress Notes (Addendum)
Initial Nutrition Assessment  RD working remotely.   DOCUMENTATION CODES:   Not applicable  INTERVENTION:  - TPN per Pharmacy. - weigh patient today.   Monitor magnesium, potassium, and phosphorus daily for at least 3 days, MD to replete as needed, as pt is at risk for refeeding syndrome given no nutrition for at least 7 days.    NUTRITION DIAGNOSIS:   Inadequate oral intake related to inability to eat as evidenced by NPO status.  GOAL:   Patient will meet greater than or equal to 90% of their needs  MONITOR:   Diet advancement, Labs, Weight trends, Skin, Other (Comment)(TPN regimen)  REASON FOR ASSESSMENT:   Consult New TPN/TNA  ASSESSMENT:   68 year old male who has not sought medical treatment in some time. He reported L inguinal hernia repair in 1991 with recurring bulging ~10 years ago. Patient reported it is usually reducible not particularly bothersome; he can usually massage it back in. Patient was admitted on 4/10 d/t worsening abdominal pain x2 days with constipation and no BM during that time. Patient had also reported progressive N/V.  BMI indicates normal weight. Patient has been NPO since admission (4/10). Patient has not been weighed since admission. Patient is POD #7 lap bilateral inguinal hernia repairs with absorbable mesh and small bowel resection d/t recurrent incarcerated L inguinal hernia with gangrenous perforation of jejunum and SBO. Noted that patient had NGT placed 4/12 and subsequently removed 4/14.   Consult for TPN received from yesterday evening. TPN was started @ 40 ml/hr yesterday evening. Estimated nutrition needs outlined below. As noted, patient has not been weighed since admission when he weighed 160 lb. Per chart review, the only other weight in the chart is from 11/12/16 when he weighed 158 lb.    Medications reviewed; 15 mmol IV KPhos x1 run 4/17.  Labs reviewed; Na: 134 mmol/l, Ca: 7.6 mg/dl, Phos: 2.4 mg/dl, Alk Phos elevated.  IVF;  D5-1/2 NS-20 mEq IV KCl @ 50 ml/hr (204 kcal).     NUTRITION - FOCUSED PHYSICAL EXAM:  unable to complete at this time.  Diet Order:   Diet Order            Diet NPO time specified Except for: Ice Chips, Sips with Meds  Diet effective now              EDUCATION NEEDS:   Not appropriate for education at this time  Skin:  Skin Assessment: Skin Integrity Issues: Skin Integrity Issues:: Incisions Incisions: abdomen (4/10)  Last BM:  4/15  Height:   Ht Readings from Last 1 Encounters:  03/18/19 '5\' 9"'$  (1.753 m)    Weight:   Wt Readings from Last 1 Encounters:  03/18/19 72.6 kg    Ideal Body Weight:  72.73 kg  BMI:  Body mass index is 23.63 kg/m.  Estimated Nutritional Needs:   Kcal:  4068-4033 kcal  Protein:  100-115 grams  Fluid:  >/= 2 L/day     Jarome Matin, MS, RD, LDN, [facility] Inpatient Clinical Dietitian Pager # [phone] After hours/weekend pager # [phone]

## 2019-03-25 NOTE — Progress Notes (Addendum)
7 Days Post-Op   Subjective/Chief Complaint: Two decent stools (one after suppository), had some flatus, still with intermittent hiccups, ambulated, sitting in chair today   Objective: Vital signs in last 24 hours: Temp:  [98.4 F (36.9 C)-98.9 F (37.2 C)] 98.4 F (36.9 C) (04/17 0538) Pulse Rate:  [88-103] 92 (04/17 0538) Resp:  [18-24] 20 (04/17 0538) BP: (105-129)/(69-83) 105/69 (04/17 0538) SpO2:  [97 %-98 %] 97 % (04/17 0538) Last BM Date: 03/23/19  Intake/Output from previous day: 04/16 0701 - 04/17 0700 In: 1479.6 [P.O.:60; I.V.:1202.1; IV Piggyback:217.5] Out: 1096 [Urine:1050; Drains:45; Stool:1] Intake/Output this shift: Total I/O In: 60 [P.O.:60] Out: 125 [Urine:125]  Gen: NAD, sitting in chair  Heart:rrr Lungs:clear bilaterally Abd: soft, less distention incisions c/d/i. JP with minimal serosang output Ext: no pain in BLE, no edema, erythema, or warmth Psych: A&O x4  Lab Results:  Recent Labs    03/24/19 0403 03/25/19 0608  WBC 13.8* 15.6*  HGB 11.2* 10.8*  HCT 34.9* 34.2*  PLT 205 323   BMET Recent Labs    03/24/19 0403 03/25/19 0608  NA 135 134*  K 3.9 3.5  CL 104 104  CO2 22 21*  GLUCOSE 118* 119*  BUN 24* 19  CREATININE 0.91 0.88  CALCIUM 7.7* 7.6*   PT/INR No results for input(s): LABPROT, INR in the last 72 hours. ABG No results for input(s): PHART, HCO3 in the last 72 hours.  Invalid input(s): PCO2, PO2  Studies/Results: Ct Abdomen Pelvis W Contrast  Result Date: 03/24/2019 CLINICAL DATA:  Status post incarcerated left inguinal hernia repair, jejunal perforation, small-bowel obstruction EXAM: CT ABDOMEN AND PELVIS WITH CONTRAST TECHNIQUE: Multidetector CT imaging of the abdomen and pelvis was performed using the standard protocol following bolus administration of intravenous contrast. CONTRAST:  OMNIPAQUE IOHEXOL 300 MG/ML  SOLN COMPARISON:  None. FINDINGS: Lower chest: Small to moderate bilateral pleural effusions  associated atelectasis or consolidation. Three-vessel coronary artery calcifications. Hepatobiliary: No focal liver abnormality is seen. No gallstones, gallbladder wall thickening, or biliary dilatation. Pancreas: Unremarkable. No pancreatic ductal dilatation or surrounding inflammatory changes. Spleen: Normal in size without focal abnormality. Adrenals/Urinary Tract: Adrenal glands are unremarkable. Kidneys are normal, without renal calculi, focal lesion, or hydronephrosis. Bladder is unremarkable. Stomach/Bowel: Stomach is within normal limits. Appendix appears normal. Status post proximal small bowel resection in the left hemiabdomen. There is extensive inflammatory thickening involving the proximal and mid small bowel in the left hemiabdomen as well as the cecum. There are multiple small collections of fluid about the bowel loops and mesentery in the left hemiabdomen (series 2, image 48, 54, 61). The most suspicious fluid collection contains small locules of air and is located posteriorly in the left hemiabdomen, measuring approximately 3.6 cm, and may be a developing abscess (series 2, image 54). Vascular/Lymphatic: Mixed calcific atherosclerosis. No enlarged abdominal or pelvic lymph nodes. Reproductive: No mass or other abnormality. Other: Fluid in the inguinal canals bilaterally. Tunneled surgical drain is looped about the central low abdomen. No abdominopelvic ascites. Musculoskeletal: No acute or significant osseous findings. IMPRESSION: 1. Status post proximal small bowel resection in the left hemiabdomen. There is extensive inflammatory thickening involving the proximal and mid small bowel in the left hemiabdomen as well as the cecum, nonspecific in the recent postoperative setting. 2. There are multiple small collections of fluid about the bowel loops and mesentery in the left hemiabdomen (series 2, image 48, 54, 61), nonspecific in the setting of recent surgery. The most suspicious fluid collection  contains  small locules of air and is located posteriorly in the left hemiabdomen, measuring approximately 3.6 cm, and may be a developing abscess (series 2, image 54). 3.  Other findings as above. Electronically Signed   By: Lauralyn Primes M.D.   On: 03/24/2019 16:31   Dg Abd Portable 1v  Result Date: 03/23/2019 CLINICAL DATA:  Postoperative ileus. EXAM: PORTABLE ABDOMEN - 1 VIEW COMPARISON:  Radiographs of March 21, 2019. FINDINGS: The bowel gas pattern is normal. No radio-opaque calculi or other significant radiographic abnormality are seen. Drainage catheter is noted in the pelvis. IMPRESSION: No evidence of bowel obstruction or ileus. Electronically Signed   By: Lupita Raider M.D.   On: 03/23/2019 13:37   [place] Ekg Site Rite  Result Date: 03/24/2019 If Site Rite image not attached, placement could not be confirmed due to current cardiac rhythm.   Anti-infectives: Anti-infectives (From admission, onward)   Start     Dose/Rate Route Frequency Ordered Stop   03/24/19 1200  piperacillin-tazobactam (ZOSYN) IVPB 3.375 g     3.375 g 12.5 mL/hr over 240 Minutes Intravenous Every 8 hours 03/24/19 1120 03/29/19 1359   03/19/19 0200  piperacillin-tazobactam (ZOSYN) IVPB 3.375 g  Status:  Discontinued     3.375 g 12.5 mL/hr over 240 Minutes Intravenous Every 8 hours 03/19/19 0028 03/19/19 0036   03/19/19 0200  piperacillin-tazobactam (ZOSYN) IVPB 3.375 g     3.375 g 12.5 mL/hr over 240 Minutes Intravenous Every 8 hours 03/19/19 0036 03/24/19 0659   03/18/19 2038  clindamycin (CLEOCIN) 900 mg, gentamicin (GARAMYCIN) 240 mg in sodium chloride 0.9 % 1,000 mL for intraperitoneal lavage  Status:  Discontinued       As needed 03/18/19 2038 03/18/19 2253   03/18/19 1845  ceFAZolin (ANCEF) IVPB 2g/100 mL premix     2 g 200 mL/hr over 30 Minutes Intravenous On call to O.R. 03/18/19 1747 03/18/19 1953   03/18/19 1845  metroNIDAZOLE (FLAGYL) IVPB 500 mg     500 mg 100 mL/hr over 60 Minutes Intravenous On  call to O.R. 03/18/19 1747 03/18/19 1953   03/18/19 1830  clindamycin (CLEOCIN) 900 mg, gentamicin (GARAMYCIN) 240 mg in sodium chloride 0.9 % 1,000 mL for intraperitoneal lavage  Status:  Discontinued    Note to Pharmacy:  Have in the  OR room for final irrigation in bowel surgery case to minimize risk of abscess/infection Pharmacy may adjust dosing strength, schedule, rate of infusion, etc as needed to optimize therapy    Irrigation To Surgery 03/18/19 1828 03/19/19 0002   03/18/19 1806  metroNIDAZOLE (FLAGYL) 5-0.79 MG/ML-% IVPB    Note to Pharmacy:  [name]   : cabinet override      03/18/19 1806 03/18/19 1923   03/18/19 1806  ceFAZolin (ANCEF) 2-4 GM/100ML-% IVPB    Note to Pharmacy:  [name]   : cabinet override      03/18/19 1806 03/18/19 1923      Assessment/Plan: POD 7 s/p laparoscopic BIH repairs with absorbable Phasix mesh with SBR for recurrent incarcerated LIH with gangrenous perforation of jejunum and SBO as well as right femoral hernia -bowel function better now, wbc going up still, afebrile, clinically looks better -reviewed ct scan yesterday and discussed with IR, nothing really to drain now, will keep on abx and likely if not better will need another ct scan in 3-4 days -mobilize and pulm toilet.  -cont JP drain for now and follow, will remove prior to dc -PT eval for mobilization, note  recs for home health -will continue zosyn for now due to ct findings ARF- resolved, Cr0.88 Tachycardia -resolved tachypnea/dyspnea-echo and duplex fine, this is better today,medicine following AMS -resolved Hypokalemia-3.5today, will check BMET in am again, replace today FEN -PICC and start TPN today VTE -Lovenox, SCDs ID -Zosyn Updated daughter Harrel CarinaMaria Mclean [phone] on patient condition  Omar LoronMatthew Rhayne Bell 03/25/2019

## 2019-03-25 NOTE — Plan of Care (Signed)
Plan of care reviewed and discussed with the patient. 

## 2019-03-25 NOTE — Progress Notes (Signed)
PHARMACY - ADULT TOTAL PARENTERAL NUTRITION CONSULT NOTE   Pharmacy Consult for TPN Indication: Prolonged ileus  Patient Measurements: Height: 5\' 9"  (175.3 cm) Weight: 160 lb (72.6 kg) IBW/kg (Calculated) : 70.7 TPN AdjBW (KG): 72.6 Body mass index is 23.63 kg/m. Usual Weight:   Current Nutrition: NPO  IVF: D5 1/2 NS with 20 KCl at 50 ml/hr  Central access: PICC to be placed 4/17 TPN start date: Plan 4/17  ASSESSMENT                                                                                                          HPI: 68 yo male presents 03/18/19 with nausea & vomiting. He has a history of left inguinal hernia repair in 1991. He was taken for emergent to surgery for incarcerated leftinguinal hernia perforation of the jejunumcausingSBO, underwent laparoscopiclysis of adhesions, small bowel resection, lap bil inguinal and right femoral hernia repairs with absorbable mesh on 03/18/2019.  Significant events:   Insulin Requirements:   Today:    Glucose: No hx DM, serum glucoses < 120  Electrolytes: Na slightly low, K at low end, Phos slightly low, corrected Ca wnl  Renal: SCr wnl  LFTs: AST/ALT wnl, AlkPhos slightly elevated  TGs: 496 (4/17) - note hx HLD  Prealbumin: 7/4 (4/17)  NUTRITIONAL GOALS                                                                                             RD recs (4/17): Kcal:  [id number] kcal Protein:  100-115 grams Fluid:  >/= 2 L/day  Custom TPN at goal rate of 90 ml/hr provides: -  119 g/day protein ( 55  g/L) -  497 g/day Dextrose ( 23 %) -  0 g/day Lipid  ( 0  G/L) - hold due to hypertriglyceridemia -  2164 Kcal/day  PLAN                                                                                                                         Now: Potassium phosphate 15mmol IV x 1  At 1800 today if PICC placed:  Start TPN at 40 ml/hr. -  Provides 53  g of protein, 220 g of dextrose which provides 864 kCals per day,  meeting 44 % of goal kcal and goal AA - Since TG > 400, will limit IV fat emulsion to once weekly; give increased protein and dextrose to compensate  Electrolytes in TPN: Standard, Cl:Ac ratio 1:2  Plan to advance as tolerated to the goal rate.  TPN to contain standard multivitamins and trace elements.  Reduce IVF to 63ml/hr.  Add CBG checks q8h - add insulin coverage if needed.   TPN lab panels on Mondays & Thursdays.  F/u daily.  Loralee Pacas, PharmD, BCPS Pager: [phone] 03/25/2019,7:27 AM

## 2019-03-25 NOTE — Progress Notes (Signed)
Peripherally Inserted Central Catheter/Midline Placement  The IV Nurse has discussed with the patient and/or persons authorized to consent for the patient, the purpose of this procedure and the potential benefits and risks involved with this procedure.  The benefits include less needle sticks, lab draws from the catheter, and the patient may be discharged home with the catheter. Risks include, but not limited to, infection, bleeding, blood clot (thrombus formation), and puncture of an artery; nerve damage and irregular heartbeat and possibility to perform a PICC exchange if needed/ordered by physician.  Alternatives to this procedure were also discussed.  Bard Power PICC patient education guide, fact sheet on infection prevention and patient information card has been provided to patient /or left at bedside.    PICC/Midline Placement Documentation  PICC Double Lumen 03/25/19 PICC Right 38 cm (Active)  Indication for Insertion or Continuance of Line Administration of hyperosmolar/irritating solutions (i.e. TPN, Vancomycin, etc.) 03/25/2019 11:00 AM  Site Assessment Clean;Dry;Intact 03/25/2019 11:00 AM  Lumen #1 Status Flushed;Blood return noted 03/25/2019 11:00 AM  Lumen #2 Status Flushed;Blood return noted 03/25/2019 11:00 AM  Dressing Type Transparent 03/25/2019 11:00 AM  Dressing Status Clean;Dry;Intact;Antimicrobial disc in place 03/25/2019 11:00 AM  Dressing Change Due 04/01/19 03/25/2019 11:00 AM       Stacie Glaze Horton 03/25/2019, 11:15 AM

## 2019-03-26 LAB — URINALYSIS, ROUTINE W REFLEX MICROSCOPIC
Bilirubin Urine: NEGATIVE
Glucose, UA: NEGATIVE mg/dL
Ketones, ur: NEGATIVE mg/dL
Leukocytes,Ua: NEGATIVE
Nitrite: NEGATIVE
Protein, ur: 30 mg/dL — AB
Specific Gravity, Urine: 1.019 (ref 1.005–1.030)
pH: 6 (ref 5.0–8.0)

## 2019-03-26 LAB — BASIC METABOLIC PANEL
Anion gap: 7 (ref 5–15)
BUN: 16 mg/dL (ref 8–23)
CO2: 23 mmol/L (ref 22–32)
Calcium: 7.7 mg/dL — ABNORMAL LOW (ref 8.9–10.3)
Chloride: 102 mmol/L (ref 98–111)
Creatinine, Ser: 0.82 mg/dL (ref 0.61–1.24)
GFR calc Af Amer: >60 mL/min (ref >60)
GFR calc non Af Amer: >60 mL/min (ref >60)
Glucose, Bld: 141 mg/dL — ABNORMAL HIGH (ref 70–99)
Potassium: 3.6 mmol/L (ref 3.5–5.1)
Sodium: 132 mmol/L — ABNORMAL LOW (ref 135–145)

## 2019-03-26 LAB — CBC
HCT: 33.1 % — ABNORMAL LOW (ref 39.0–52.0)
Hemoglobin: 10.8 g/dL — ABNORMAL LOW (ref 13.0–17.0)
MCH: 30.6 pg (ref 26.0–34.0)
MCHC: 32.6 g/dL (ref 30.0–36.0)
MCV: 93.8 fL (ref 80.0–100.0)
Platelets: 418 10*3/uL — ABNORMAL HIGH (ref 150–400)
RBC: 3.53 MIL/uL — ABNORMAL LOW (ref 4.22–5.81)
RDW: 13.2 % (ref 11.5–15.5)
WBC: 18.4 10*3/uL — ABNORMAL HIGH (ref 4.0–10.5)
nRBC: 0 % (ref 0.0–0.2)

## 2019-03-26 LAB — GLUCOSE, CAPILLARY: Glucose-Capillary: 116 mg/dL — ABNORMAL HIGH (ref 70–99)

## 2019-03-26 MED ORDER — HYDRALAZINE HCL 20 MG/ML IJ SOLN: 5.0000 mg | INTRAMUSCULAR | Status: DC | PRN

## 2019-03-26 MED ORDER — TRAVASOL 10 % IV SOLN
INTRAVENOUS | Status: DC
  Filled 2019-03-26: qty 792

## 2019-03-26 MED ORDER — TRAVASOL 10 % IV SOLN
INTRAVENOUS | Status: AC
  Administered 2019-03-26: 17:00:00 via INTRAVENOUS
  Filled 2019-03-26: qty 792

## 2019-03-26 MED ORDER — METOPROLOL TARTRATE 5 MG/5ML IV SOLN: 5.0000 mg | Freq: Four times a day (QID) | INTRAVENOUS | Status: DC | PRN

## 2019-03-26 NOTE — Progress Notes (Signed)
8 Days Post-Op   Subjective/Chief Complaint: Feels about the same.  No BM's yesterday.  Objective: Vital signs in last 24 hours: Temp:  [98.6 F (37 C)-99.4 F (37.4 C)] 98.8 F (37.1 C) (04/18 0507) Pulse Rate:  [87-94] 90 (04/18 0507) Resp:  [16-20] 16 (04/18 0507) BP: (115-125)/(68-74) 125/74 (04/18 0507) SpO2:  [95 %-99 %] 96 % (04/18 0507) Last BM Date: 03/24/19  Intake/Output from previous day: 04/17 0701 - 04/18 0700 In: 1603.1 [P.O.:60; I.V.:1151.5; IV Piggyback:391.6] Out: 995 [Urine:950; Drains:45] Intake/Output this shift: No intake/output data recorded.  Gen: NAD, sitting in chair  Lungs:clear  Abd: soft, min distention, incisions c/d/i. JP with minimal serosang output Ext: no pain in BLE, no edema, erythema, or warmth Psych: A&O x4  Lab Results:  Recent Labs    03/25/19 0608 03/26/19 0316  WBC 15.6* 18.4*  HGB 10.8* 10.8*  HCT 34.2* 33.1*  PLT 323 418*   BMET Recent Labs    03/25/19 0608 03/26/19 0316  NA 134* 132*  K 3.5 3.6  CL 104 102  CO2 21* 23  GLUCOSE 119* 141*  BUN 19 16  CREATININE 0.88 0.82  CALCIUM 7.6* 7.7*   PT/INR No results for input(s): LABPROT, INR in the last 72 hours. ABG No results for input(s): PHART, HCO3 in the last 72 hours.  Invalid input(s): PCO2, PO2  Studies/Results: Ct Abdomen Pelvis W Contrast  Result Date: 03/24/2019 CLINICAL DATA:  Status post incarcerated left inguinal hernia repair, jejunal perforation, small-bowel obstruction EXAM: CT ABDOMEN AND PELVIS WITH CONTRAST TECHNIQUE: Multidetector CT imaging of the abdomen and pelvis was performed using the standard protocol following bolus administration of intravenous contrast. CONTRAST:  OMNIPAQUE IOHEXOL 300 MG/ML  SOLN COMPARISON:  None. FINDINGS: Lower chest: Small to moderate bilateral pleural effusions associated atelectasis or consolidation. Three-vessel coronary artery calcifications. Hepatobiliary: No focal liver abnormality is seen. No  gallstones, gallbladder wall thickening, or biliary dilatation. Pancreas: Unremarkable. No pancreatic ductal dilatation or surrounding inflammatory changes. Spleen: Normal in size without focal abnormality. Adrenals/Urinary Tract: Adrenal glands are unremarkable. Kidneys are normal, without renal calculi, focal lesion, or hydronephrosis. Bladder is unremarkable. Stomach/Bowel: Stomach is within normal limits. Appendix appears normal. Status post proximal small bowel resection in the left hemiabdomen. There is extensive inflammatory thickening involving the proximal and mid small bowel in the left hemiabdomen as well as the cecum. There are multiple small collections of fluid about the bowel loops and mesentery in the left hemiabdomen (series 2, image 48, 54, 61). The most suspicious fluid collection contains small locules of air and is located posteriorly in the left hemiabdomen, measuring approximately 3.6 cm, and may be a developing abscess (series 2, image 54). Vascular/Lymphatic: Mixed calcific atherosclerosis. No enlarged abdominal or pelvic lymph nodes. Reproductive: No mass or other abnormality. Other: Fluid in the inguinal canals bilaterally. Tunneled surgical drain is looped about the central low abdomen. No abdominopelvic ascites. Musculoskeletal: No acute or significant osseous findings. IMPRESSION: 1. Status post proximal small bowel resection in the left hemiabdomen. There is extensive inflammatory thickening involving the proximal and mid small bowel in the left hemiabdomen as well as the cecum, nonspecific in the recent postoperative setting. 2. There are multiple small collections of fluid about the bowel loops and mesentery in the left hemiabdomen (series 2, image 48, 54, 61), nonspecific in the setting of recent surgery. The most suspicious fluid collection contains small locules of air and is located posteriorly in the left hemiabdomen, measuring approximately 3.6 cm, and may  be a developing  abscess (series 2, image 54). 3.  Other findings as above. Electronically Signed   By: Lauralyn PrimesAlex  Bibbey M.D.   On: 03/24/2019 16:31   [place] Ekg Site Rite  Result Date: 03/24/2019 If Site Rite image not attached, placement could not be confirmed due to current cardiac rhythm.   Anti-infectives: Anti-infectives (From admission, onward)   Start     Dose/Rate Route Frequency Ordered Stop   03/24/19 1200  piperacillin-tazobactam (ZOSYN) IVPB 3.375 g     3.375 g 12.5 mL/hr over 240 Minutes Intravenous Every 8 hours 03/24/19 1120 03/29/19 1359   03/19/19 0200  piperacillin-tazobactam (ZOSYN) IVPB 3.375 g  Status:  Discontinued     3.375 g 12.5 mL/hr over 240 Minutes Intravenous Every 8 hours 03/19/19 0028 03/19/19 0036   03/19/19 0200  piperacillin-tazobactam (ZOSYN) IVPB 3.375 g     3.375 g 12.5 mL/hr over 240 Minutes Intravenous Every 8 hours 03/19/19 0036 03/24/19 0659   03/18/19 2038  clindamycin (CLEOCIN) 900 mg, gentamicin (GARAMYCIN) 240 mg in sodium chloride 0.9 % 1,000 mL for intraperitoneal lavage  Status:  Discontinued       As needed 03/18/19 2038 03/18/19 2253   03/18/19 1845  ceFAZolin (ANCEF) IVPB 2g/100 mL premix     2 g 200 mL/hr over 30 Minutes Intravenous On call to O.R. 03/18/19 1747 03/18/19 1953   03/18/19 1845  metroNIDAZOLE (FLAGYL) IVPB 500 mg     500 mg 100 mL/hr over 60 Minutes Intravenous On call to O.R. 03/18/19 1747 03/18/19 1953   03/18/19 1830  clindamycin (CLEOCIN) 900 mg, gentamicin (GARAMYCIN) 240 mg in sodium chloride 0.9 % 1,000 mL for intraperitoneal lavage  Status:  Discontinued    Note to Pharmacy:  Have in the  OR room for final irrigation in bowel surgery case to minimize risk of abscess/infection Pharmacy may adjust dosing strength, schedule, rate of infusion, etc as needed to optimize therapy    Irrigation To Surgery 03/18/19 1828 03/19/19 0002   03/18/19 1806  metroNIDAZOLE (FLAGYL) 5-0.79 MG/ML-% IVPB    Note to Pharmacy:  Marney DoctorMentley, Pamela   : cabinet  override      03/18/19 1806 03/18/19 1923   03/18/19 1806  ceFAZolin (ANCEF) 2-4 GM/100ML-% IVPB    Note to Pharmacy:  Marney DoctorMentley, Pamela   : cabinet override      03/18/19 1806 03/18/19 1923      Assessment/Plan: POD 8 s/p laparoscopic BIH repairs with absorbable Phasix mesh with SBR for 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  -cont JP drain for now and follow, will remove prior to d/c -PT eval for mobilization, note recs for home health -will continue zosyn for now due to ct findings ARF- resolved, Cr0.88 Tachycardia -resolved tachypnea/dyspnea-echo and duplex fine, this is better today,medicine following AMS -resolved Hypokalemia-3.6today FEN -PICC, cont TPN today VTE -Lovenox, SCDs ID -Zosyn  Attempted to update daughter Harrel CarinaMaria Mclean [phone] on patient condition, but she did not answer  Vanita Pandalicia C Ariona Deschene 03/26/2019

## 2019-03-26 NOTE — Progress Notes (Addendum)
PROGRESS NOTE    Omar Bell  WVP:[id number] DOB: 1951/06/07 DOA: 03/18/2019 PCP: Benita Stabile, MD   Brief Narrative:  Omar Bell an 68 y.o.malewith prior h/o hyperlipidemia, initially presented to ED with incarcerated leftinguinal hernia perforation of the jejunumcausingSBO, underwent laparoscopiclysis of adhesions, small bowel resection, lap bil inguinal and right femoral hernia repairs with absorbable mesh,by surgery on 03/18/2019.  Medical team was consulted for confusion, tachycardia tachypnea.   Assessment & Plan   Acute metabolic encephalopathy -resolved, appears to be AAOx3 -Ammonia, TSH and vitamin B12 within normal limits -Given that no neuro deficits, and encephalopathy appears to resolved, no indication for CT of the head at this time  Tachycardia and tachypnea-likely acute diastolic heart failure -Chest x-ray showed possible left lower lobe consolidation with bilateral pleural effusion -Likely secondary to fluid overload given significant positive fluid balance -Currently afebrile with no leukocytosis -Tachycardia has improved -Continue nebulizer treatments as needed -Patient did receive IV Lasix -Continue to monitor intake and output, daily weights -Echocardiogram EF 60 to 65%, LV diastolic Doppler parameters are consistent with impaired relaxation -Extremity Doppler unremarkable for DVT  Leukocytosis -Continues to increase- 18.4, however patient currently on Zosyn -CT abdomen pelvis showed possible developing abscess -General surgery discussed with interventional radiology, currently not drainable at this time.  Will likely need repeat CT scan in 3 to 4 days if not improved. -UA unremarkable for infection -Lung exam clear -currently afebrile  Hypokalemia -Continue to monitor BMP and replace as needed  Incarcerated left inguinal hernia with gangrenous perforation of the jejunum and SBO -Per general surgery  Acute anemia of blood loss  -Secondary to surgery -Hemoglobin currently 10.8, was 14.80 on admission however suspect this is dilutional    -Continue to monitor CBC, transfuse if hemoglobin below 7  Acute kidney injury -Resolved, continue to monitor BMP  Right bundle branch block -No complaints of chest pain, troponin negative on admission x2 right bundle branch block   DVT Prophylaxis  Lovenox   Code Status: Full  Family Communication: None at bedside  Disposition Plan: Admitted. 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 as well as right femoral hernia  Echocardiogram  Lower extremity doppler  Antibiotics   Anti-infectives (From admission, onward)   Start     Dose/Rate Route Frequency Ordered Stop   03/24/19 1200  piperacillin-tazobactam (ZOSYN) IVPB 3.375 g     3.375 g 12.5 mL/hr over 240 Minutes Intravenous Every 8 hours 03/24/19 1120 03/29/19 1359   03/19/19 0200  piperacillin-tazobactam (ZOSYN) IVPB 3.375 g  Status:  Discontinued     3.375 g 12.5 mL/hr over 240 Minutes Intravenous Every 8 hours 03/19/19 0028 03/19/19 0036   03/19/19 0200  piperacillin-tazobactam (ZOSYN) IVPB 3.375 g     3.375 g 12.5 mL/hr over 240 Minutes Intravenous Every 8 hours 03/19/19 0036 03/24/19 0659   03/18/19 2038  clindamycin (CLEOCIN) 900 mg, gentamicin (GARAMYCIN) 240 mg in sodium chloride 0.9 % 1,000 mL for intraperitoneal lavage  Status:  Discontinued       As needed 03/18/19 2038 03/18/19 2253   03/18/19 1845  ceFAZolin (ANCEF) IVPB 2g/100 mL premix     2 g 200 mL/hr over 30 Minutes Intravenous On call to O.R. 03/18/19 1747 03/18/19 1953   03/18/19 1845  metroNIDAZOLE (FLAGYL) IVPB 500 mg     500 mg 100 mL/hr over 60 Minutes Intravenous On  call to O.R. 03/18/19 1747 03/18/19 1953   03/18/19 1830  clindamycin (CLEOCIN) 900 mg, gentamicin (GARAMYCIN) 240 mg in  sodium chloride 0.9 % 1,000 mL for intraperitoneal lavage  Status:  Discontinued    Note to Pharmacy:  Have in the  OR room for final irrigation in bowel surgery case to minimize risk of abscess/infection Pharmacy may adjust dosing strength, schedule, rate of infusion, etc as needed to optimize therapy    Irrigation To Surgery 03/18/19 1828 03/19/19 0002   03/18/19 1806  metroNIDAZOLE (FLAGYL) 5-0.79 MG/ML-% IVPB    Note to Pharmacy:  Marney DoctorMentley, Pamela   : cabinet override      03/18/19 1806 03/18/19 1923   03/18/19 1806  ceFAZolin (ANCEF) 2-4 GM/100ML-% IVPB    Note to Pharmacy:  Marney DoctorMentley, Pamela   : cabinet override      03/18/19 1806 03/18/19 1923      Subjective:   Omar Bell seen and examined today.  Sitting up in chair.  Complains of abdominal pain and hiccups. Denies chest pain, shortness of breath, cough, dizziness, headache. Objective:   Vitals:   03/25/19 0538 03/25/19 1349 03/25/19 2055 03/26/19 0507  BP: 105/69 115/68 125/72 125/74  Pulse: 92 87 94 90  Resp: 20 18 20 16   Temp: 98.4 F (36.9 C) 99.4 F (37.4 C) 98.6 F (37 C) 98.8 F (37.1 C)  TempSrc: Oral Oral Oral Oral  SpO2: 97% 99% 95% 96%  Weight:      Height:        Intake/Output Summary (Last 24 hours) at 03/26/2019 1155 Last data filed at 03/26/2019 0851 Gross per 24 hour  Intake 1543.14 ml  Output 755 ml  Net 788.14 ml   Filed Weights   03/18/19 1534  Weight: 72.6 kg   Exam  General: Well developed, chronically ill appearing, NAD  HEENT: NCAT, mucous membranes moist.   Neck: Supple  Cardiovascular: S1 S2 auscultated, RRR, nonfocal  Respiratory: Clear to auscultation bilaterally   Abdomen: Soft, mildly distended. JP Drain   Extremities: warm dry without cyanosis clubbing or edema  Neuro: AAOx3, nonfocal  Psych: Appropriate mood and affect  Data Reviewed: I have personally reviewed following labs and imaging studies  CBC: Recent Labs  Lab 03/22/19 0301 03/23/19 0509 03/24/19  0403 03/25/19 0608 03/26/19 0316  WBC 10.5 10.9* 13.8* 15.6* 18.4*  NEUTROABS  --   --   --  11.5*  --   HGB 10.2* 10.5* 11.2* 10.8* 10.8*  HCT 32.7* 33.6* 34.9* 34.2* 33.1*  MCV 95.9 96.6 95.4 95.8 93.8  PLT 145* 177 205 323 418*   Basic Metabolic Panel: Recent Labs  Lab 03/20/19 0407  03/22/19 0301 03/23/19 0509 03/24/19 0403 03/25/19 0608 03/26/19 0316  NA 142   < > 137 134* 135 134* 132*  K 3.1*   < > 3.7 3.8 3.9 3.5 3.6  CL 105   < > 107 103 104 104 102  CO2 24   < > 23 24 22  21* 23  GLUCOSE 107*   < > 126* 118* 118* 119* 141*  BUN 26*   < > 20 22 24* 19 16  CREATININE 1.39*   < > 0.92 0.87 0.91 0.88 0.82  CALCIUM 6.7*   < > 7.4* 7.5* 7.7* 7.6* 7.7*  MG 2.4  --   --  2.1  --  1.9  --   PHOS  --   --   --   --   --  2.4*  --    < > =  values in this interval not displayed.   GFR: Estimated Creatinine Clearance: 85 mL/min (by C-G formula based on SCr of 0.82 mg/dL). Liver Function Tests: Recent Labs  Lab 03/25/19 0608  AST 34  ALT 27  ALKPHOS 138*  BILITOT 1.2  PROT 5.6*  ALBUMIN 2.0*   No results for input(s): LIPASE, AMYLASE in the last 168 hours. Recent Labs  Lab 03/21/19 1401  AMMONIA 21   Coagulation Profile: No results for input(s): INR, PROTIME in the last 168 hours. Cardiac Enzymes: No results for input(s): CKTOTAL, CKMB, CKMBINDEX, TROPONINI in the last 168 hours. BNP (last 3 results) No results for input(s): PROBNP in the last 8760 hours. HbA1C: No results for input(s): HGBA1C in the last 72 hours. CBG: Recent Labs  Lab 03/25/19 2355  GLUCAP 136*   Lipid Profile: Recent Labs    03/25/19 0608  TRIG 496*   Thyroid Function Tests: No results for input(s): TSH, T4TOTAL, FREET4, T3FREE, THYROIDAB in the last 72 hours. Anemia Panel: No results for input(s): VITAMINB12, FOLATE, FERRITIN, TIBC, IRON, RETICCTPCT in the last 72 hours. Urine analysis:    Component Value Date/Time   COLORURINE YELLOW 03/25/2019 2343   APPEARANCEUR CLEAR  03/25/2019 2343   LABSPEC 1.019 03/25/2019 2343   PHURINE 6.0 03/25/2019 2343   GLUCOSEU NEGATIVE 03/25/2019 2343   HGBUR SMALL (A) 03/25/2019 2343   BILIRUBINUR NEGATIVE 03/25/2019 2343   KETONESUR NEGATIVE 03/25/2019 2343   PROTEINUR 30 (A) 03/25/2019 2343   NITRITE NEGATIVE 03/25/2019 2343   LEUKOCYTESUR NEGATIVE 03/25/2019 2343   Sepsis Labs: (procalcitonin:4,lacticidven:4)  )No results found for this or any previous visit (from the past 240 hour(s)).    Radiology Studies: Ct Abdomen Pelvis W Contrast  Result Date: 03/24/2019 CLINICAL DATA:  Status post incarcerated left inguinal hernia repair, jejunal perforation, small-bowel obstruction EXAM: CT ABDOMEN AND PELVIS WITH CONTRAST TECHNIQUE: Multidetector CT imaging of the abdomen and pelvis was performed using the standard protocol following bolus administration of intravenous contrast. CONTRAST:  OMNIPAQUE IOHEXOL 300 MG/ML  SOLN COMPARISON:  None. FINDINGS: Lower chest: Small to moderate bilateral pleural effusions associated atelectasis or consolidation. Three-vessel coronary artery calcifications. Hepatobiliary: No focal liver abnormality is seen. No gallstones, gallbladder wall thickening, or biliary dilatation. Pancreas: Unremarkable. No pancreatic ductal dilatation or surrounding inflammatory changes. Spleen: Normal in size without focal abnormality. Adrenals/Urinary Tract: Adrenal glands are unremarkable. Kidneys are normal, without renal calculi, focal lesion, or hydronephrosis. Bladder is unremarkable. Stomach/Bowel: Stomach is within normal limits. Appendix appears normal. Status post proximal small bowel resection in the left hemiabdomen. There is extensive inflammatory thickening involving the proximal and mid small bowel in the left hemiabdomen as well as the cecum. There are multiple small collections of fluid about the bowel loops and mesentery in the left hemiabdomen (series 2, image 48, 54, 61). The most  suspicious fluid collection contains small locules of air and is located posteriorly in the left hemiabdomen, measuring approximately 3.6 cm, and may be a developing abscess (series 2, image 54). Vascular/Lymphatic: Mixed calcific atherosclerosis. No enlarged abdominal or pelvic lymph nodes. Reproductive: No mass or other abnormality. Other: Fluid in the inguinal canals bilaterally. Tunneled surgical drain is looped about the central low abdomen. No abdominopelvic ascites. Musculoskeletal: No acute or significant osseous findings. IMPRESSION: 1. Status post proximal small bowel resection in the left hemiabdomen. There is extensive inflammatory thickening involving the proximal and mid small bowel in the left hemiabdomen as well as the cecum, nonspecific in the recent postoperative setting. 2.  There are multiple small collections of fluid about the bowel loops and mesentery in the left hemiabdomen (series 2, image 48, 54, 61), nonspecific in the setting of recent surgery. The most suspicious fluid collection contains small locules of air and is located posteriorly in the left hemiabdomen, measuring approximately 3.6 cm, and may be a developing abscess (series 2, image 54). 3.  Other findings as above. Electronically Signed   By: Lauralyn Primes M.D.   On: 03/24/2019 16:31   [place] Ekg Site Rite  Result Date: 03/24/2019 If Site Rite image not attached, placement could not be confirmed due to current cardiac rhythm.    Scheduled Meds: . enoxaparin (LOVENOX) injection  40 mg Subcutaneous Q24H  . lip balm  1 application Topical BID  . metoprolol tartrate  2.5 mg Intravenous Q8H  . sodium chloride flush  10-40 mL Intracatheter Q12H   Continuous Infusions: . dextrose 5 % and 0.45 % NaCl with KCl 20 mEq/L 10 mL/hr at 03/26/19 0645  . methocarbamol (ROBAXIN) IV    . ondansetron (ZOFRAN) IV    . piperacillin-tazobactam (ZOSYN)  IV 3.375 g (03/26/19 0603)  . TPN ADULT (ION) 40 mL/hr at 03/25/19 1844  . TPN ADULT  (ION)       LOS: 8 days   Time Spent in minutes   15 minutes  Jamese Trauger D.O. on 03/26/2019 at 11:55 AM  Between 7am to 7pm - Please see pager noted on [email or web address]  After 7pm go to [email or web address]  And look for the night coverage person covering for me after hours  [facility] Office  [phone]

## 2019-03-26 NOTE — Progress Notes (Signed)
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  Pt fatigues quickly and requires increased time.   Follow Up Recommendations  Home health PT;Supervision for mobility/OOB     Equipment Recommendations  Other (comment)(4WW with seat (rollator))    Recommendations for Other Services       Precautions / Restrictions Precautions Precautions: Fall Precaution Comments: JP Drain Restrictions Weight Bearing Restrictions: No    Mobility  Bed Mobility Overal bed mobility: Needs Assistance Bed Mobility: Sit to Supine       Sit to supine: Min assist   General bed mobility comments: pt up in rocking chair on arrival, requested assist for LEs onto bed  Transfers Overall transfer level: Needs assistance Equipment used: Rolling walker (2 wheeled) Transfers: Sit to/from Stand Sit to Stand: Min guard;Min assist         General transfer comment: verbal cues for hand placement and brake use of rollator, occasionally requesetd "boost" assist from rollator with rest breaks  Ambulation/Gait Ambulation/Gait assistance: Min guard Gait Distance (Feet): 400 Feet Assistive device: 4-wheeled walker Gait Pattern/deviations: Step-through pattern;Trunk flexed;Decreased stride length Gait velocity: decreased    General Gait Details: required 2 seated rest breaks, pt c/o pain and fatigue   Stairs             Wheelchair Mobility    Modified Rankin (Stroke Patients Only)       Balance                                            Cognition  Arousal/Alertness: Awake/alert Behavior During Therapy: WFL for tasks assessed/performed Overall Cognitive Status: Within Functional Limits for tasks assessed                                 General Comments: required increased time      Exercises      General Comments        Pertinent Vitals/Pain Pain Assessment: 0-10 Pain Score: 5  Pain Location: ABD with activity Pain Descriptors / Indicators: Discomfort;Grimacing;Tender Pain Intervention(s): Monitored during session;Repositioned(declines pain meds)    Home Living                      Prior Function            PT Goals (current goals can now be found in the care plan section) Progress towards PT goals: Progressing toward goals    Frequency    Min 3X/week      PT Plan Current plan remains appropriate    Co-evaluation              AM-PAC PT "6 Clicks" Mobility   Outcome Measure  Help needed turning from your back to your side while in a flat bed without using bedrails?: A Little Help needed moving from lying on your back to sitting on the side of a flat bed without using  bedrails?: A Little Help needed moving to and from a bed to a chair (including a wheelchair)?: A Little Help needed standing up from a chair using your arms (e.g., wheelchair or bedside chair)?: A Little Help needed to walk in hospital room?: A Little Help needed climbing 3-5 steps with a railing? : A Little 6 Click Score: 18    End of Session Equipment Utilized During Treatment: Gait belt Activity Tolerance: Patient limited by fatigue Patient left: in bed;with call bell/phone within reach;with bed alarm set   PT Visit Diagnosis: Other abnormalities of gait and mobility (R26.89)     Time: [id number] PT Time Calculation (min) (ACUTE ONLY): 27 min  Charges:  $Gait Training: 23-37 mins                     Zenovia JarredKati Devany Aja, PT, DPT Acute Rehabilitation Services Office: [phone] Pager:  [phone]  Maida SaleLEMYRE,KATHrine E 03/26/2019, 11:03 AM

## 2019-03-26 NOTE — Progress Notes (Addendum)
PHARMACY - ADULT TOTAL PARENTERAL NUTRITION CONSULT NOTE   Pharmacy Consult for TPN Indication: Prolonged ileus  Patient Measurements: Height: 5\' 9"  (175.3 cm) Weight: 160 lb (72.6 kg) IBW/kg (Calculated) : 70.7 TPN AdjBW (KG): 72.6 Body mass index is 23.63 kg/m.  Current Nutrition: NPO  IVF: D5 1/2 NS with 20 KCl at 50 ml/hr  Central access: PICC to be placed 4/17 TPN start date: Plan 4/17  ASSESSMENT                                                                                                          HPI: 68 yo male presents 03/18/19 with nausea & vomiting. He has a history of left inguinal hernia repair in 1991. He was taken for emergent to surgery for incarcerated leftinguinal hernia perforation of the jejunumcausingSBO, underwent laparoscopiclysis of adhesions, small bowel resection, lap bil inguinal and right femoral hernia repairs with absorbable mesh on 03/18/2019.  Significant events:   Insulin Requirements: n/a  Today, 03/26/19  Glucose: No hx DM, CBGs at goal < 150  Electrolytes: Na slightly low, K at low end  Renal: SCr wnl  LFTs: AST/ALT wnl, AlkPhos slightly elevated  TGs: 496 (4/17) - note hx HLD  Prealbumin: 7.4 (4/17)  NUTRITIONAL GOALS                                                                                             RD recs (4/17): Kcal:  8101-7510 kcal Protein:  100-115 grams Fluid:  >/= 2 L/day  Custom TPN at goal rate of 90 ml/hr provides: -  119 g/day protein ( 55  g/L) -  497 g/day Dextrose ( 23 %) -  0 g/day Lipid  ( 0  G/L) - hold due to hypertriglyceridemia -  2164 Kcal/day  PLAN                                                                                                                         At 1800 today:  Continue custom TPN and will increase rate from 40 ml/hr to 60 ml/hr - Provides 79 g of protein, 331 g of dextrose which provides 1443 kCals per day -  Since TG > 400, will limit IV fat emulsion to once weekly;  give increased protein and dextrose to compensate  Electrolytes in TPN: standard except increase Na and K, Cl:Ac ratio 1:2  Plan to advance as tolerated to the goal rate.  TPN to contain standard MVI but to give trace elements only on MWF due to current national shortage  Continue IVF at 6510ml/hr.  Continue CBG checks q8h - add insulin coverage if needed.   TPN lab panels on Mondays & Thursdays.  F/u daily.   Hessie KnowsJustin M Almadelia Looman, PharmD, BCPS Pager [phone] 03/26/2019 7:48 AM

## 2019-03-27 LAB — COMPREHENSIVE METABOLIC PANEL
ALT: 30 U/L (ref 0–44)
AST: 34 U/L (ref 15–41)
Albumin: 2.2 g/dL — ABNORMAL LOW (ref 3.5–5.0)
Alkaline Phosphatase: 189 U/L — ABNORMAL HIGH (ref 38–126)
Anion gap: 7 (ref 5–15)
BUN: 18 mg/dL (ref 8–23)
CO2: 22 mmol/L (ref 22–32)
Calcium: 7.7 mg/dL — ABNORMAL LOW (ref 8.9–10.3)
Chloride: 102 mmol/L (ref 98–111)
Creatinine, Ser: 0.86 mg/dL (ref 0.61–1.24)
GFR calc Af Amer: >60 mL/min (ref >60)
GFR calc non Af Amer: >60 mL/min (ref >60)
Glucose, Bld: 154 mg/dL — ABNORMAL HIGH (ref 70–99)
Potassium: 4 mmol/L (ref 3.5–5.1)
Sodium: 131 mmol/L — ABNORMAL LOW (ref 135–145)
Total Bilirubin: 1 mg/dL (ref 0.3–1.2)
Total Protein: 6.1 g/dL — ABNORMAL LOW (ref 6.5–8.1)

## 2019-03-27 LAB — CBC
HCT: 33.8 % — ABNORMAL LOW (ref 39.0–52.0)
Hemoglobin: 11.1 g/dL — ABNORMAL LOW (ref 13.0–17.0)
MCH: 30.8 pg (ref 26.0–34.0)
MCHC: 32.8 g/dL (ref 30.0–36.0)
MCV: 93.9 fL (ref 80.0–100.0)
Platelets: 530 10*3/uL — ABNORMAL HIGH (ref 150–400)
RBC: 3.6 MIL/uL — ABNORMAL LOW (ref 4.22–5.81)
RDW: 13.1 % (ref 11.5–15.5)
WBC: 19.4 10*3/uL — ABNORMAL HIGH (ref 4.0–10.5)
nRBC: 0 % (ref 0.0–0.2)

## 2019-03-27 LAB — GLUCOSE, CAPILLARY
Glucose-Capillary: 141 mg/dL — ABNORMAL HIGH (ref 70–99)
Glucose-Capillary: 142 mg/dL — ABNORMAL HIGH (ref 70–99)
Glucose-Capillary: 153 mg/dL — ABNORMAL HIGH (ref 70–99)

## 2019-03-27 LAB — PHOSPHORUS: Phosphorus: 2.9 mg/dL (ref 2.5–4.6)

## 2019-03-27 LAB — MAGNESIUM: Magnesium: 2.1 mg/dL (ref 1.7–2.4)

## 2019-03-27 MED ORDER — SODIUM CHLORIDE 0.9 % IV SOLN
12.5000 mg | Freq: Four times a day (QID) | INTRAVENOUS | Status: DC | PRN
  Administered 2019-03-27: 13:00:00 12.5 mg via INTRAVENOUS
  Filled 2019-03-27: qty 0.5

## 2019-03-27 MED ORDER — TRAVASOL 10 % IV SOLN
INTRAVENOUS | Status: DC
  Administered 2019-03-27: 17:00:00 via INTRAVENOUS
  Filled 2019-03-27: qty 990

## 2019-03-27 NOTE — Plan of Care (Signed)
  Problem: Clinical Measurements: Goal: Ability to maintain clinical measurements within normal limits will improve Outcome: Progressing Goal: Postoperative complications will be avoided or minimized Outcome: Progressing   Problem: Health Behavior/Discharge Planning: Goal: Ability to manage health-related needs will improve Outcome: Progressing   Problem: Activity: Goal: Risk for activity intolerance will decrease Outcome: Progressing   Problem: Nutrition: Goal: Adequate nutrition will be maintained Outcome: Progressing   Problem: Coping: Goal: Level of anxiety will decrease Outcome: Progressing   Problem: Elimination: Goal: Will not experience complications related to bowel motility Outcome: Progressing   Problem: Pain Managment: Goal: General experience of comfort will improve Outcome: Progressing

## 2019-03-27 NOTE — Progress Notes (Signed)
PHARMACY - ADULT TOTAL PARENTERAL NUTRITION CONSULT NOTE   Pharmacy Consult for TPN Indication: Prolonged ileus  Patient Measurements: Height: 5\' 9"  (175.3 cm) Weight: 160 lb (72.6 kg) IBW/kg (Calculated) : 70.7 TPN AdjBW (KG): 72.6 Body mass index is 23.63 kg/m.  Current Nutrition: NPO  IVF: D5 1/2 NS with 20 KCl at 10 ml/hr  Central access: PICC to be placed 4/17 TPN start date: Plan 4/17  ASSESSMENT                                                                                                          HPI: 68 yo male presents 03/18/19 with nausea & vomiting. He has a history of left inguinal hernia repair in 1991. He was taken for emergent to surgery for incarcerated leftinguinal hernia perforation of the jejunumcausingSBO, underwent laparoscopiclysis of adhesions, small bowel resection, lap bil inguinal and right femoral hernia repairs with absorbable mesh on 03/18/2019.  Significant events:   Insulin Requirements: n/a  Today, 03/27/19  Glucose: No hx DM, CBGs at goal < 150  Electrolytes: WNL except Na 131 trending down  Renal: SCr wnl  LFTs: AST/ALT wnl, AlkPhos elevated and rising  TGs: 496 (4/17) - note hx HLD, recheck 4/20  Prealbumin: 7.4 (4/17)  NUTRITIONAL GOALS                                                                                             RD recs (4/17): Kcal:  [id number] kcal Protein:  100-115 grams Fluid:  >/= 2 L/day  Custom TPN at goal rate of 90 ml/hr provides: -  119 g/day protein ( 55  g/L) -  497 g/day Dextrose ( 23 %) -  0 g/day Lipid  ( 0  G/L) - hold due to hypertriglyceridemia -  2164 Kcal/day  PLAN                                                                                                                         At 1800 today:  Continue custom TPN and will increase rate from 60 ml/hr to 75 ml/hr - Provides 99 g of protein, 414 g of dextrose which provides 1803 kCals  per day - Since TG > 400, will limit IV fat  emulsion to once weekly; give increased protein and dextrose to compensate  Electrolytes in TPN: standard except increased Na and K, Cl:Ac ratio 1:2  Plan to advance as tolerated to the goal rate.  TPN to contain standard MVI but to give trace elements only on MWF due to current national shortage  Continue IVF at 48ml/hr.  Continue CBG checks q8h - add insulin coverage if needed.   TPN lab panels on Mondays & Thursdays.  F/u daily.   Hessie Knows, PharmD, BCPS Pager [phone] 03/27/2019 8:09 AM

## 2019-03-27 NOTE — Progress Notes (Signed)
9 Days Post-Op   Subjective/Chief Complaint: Feels a bit better today.  1 BM yesterday.  Ambulated some  Objective: Vital signs in last 24 hours: Temp:  [98 F (36.7 C)-98.9 F (37.2 C)] 98 F (36.7 C) (04/19 0542) Pulse Rate:  [76-101] 101 (04/19 0542) Resp:  [16-18] 16 (04/19 0542) BP: (120-129)/(67-77) 129/77 (04/19 0542) SpO2:  [95 %-98 %] 95 % (04/19 0542) Last BM Date: 03/24/19  Intake/Output from previous day: 04/18 0701 - 04/19 0700 In: 1493.5 [P.O.:60; I.V.:1283.6; IV Piggyback:149.9] Out: 1080 [Urine:1075; Drains:5] Intake/Output this shift: No intake/output data recorded.  Gen: NAD, sitting in chair  Lungs:clear  Abd: soft, min distention, incisions c/d/i. JP with minimal serosang output Ext: no pain in BLE, no edema, erythema, or warmth Psych: A&O x4  Lab Results:  Recent Labs    03/25/19 0608 03/26/19 0316  WBC 15.6* 18.4*  HGB 10.8* 10.8*  HCT 34.2* 33.1*  PLT 323 418*   BMET Recent Labs    03/26/19 0316 03/27/19 0408  NA 132* 131*  K 3.6 4.0  CL 102 102  CO2 23 22  GLUCOSE 141* 154*  BUN 16 18  CREATININE 0.82 0.86  CALCIUM 7.7* 7.7*   PT/INR No results for input(s): LABPROT, INR in the last 72 hours. ABG No results for input(s): PHART, HCO3 in the last 72 hours.  Invalid input(s): PCO2, PO2  Studies/Results: No results found.  Anti-infectives: Anti-infectives (From admission, onward)   Start     Dose/Rate Route Frequency Ordered Stop   03/24/19 1200  piperacillin-tazobactam (ZOSYN) IVPB 3.375 g     3.375 g 12.5 mL/hr over 240 Minutes Intravenous Every 8 hours 03/24/19 1120 03/29/19 1359   03/19/19 0200  piperacillin-tazobactam (ZOSYN) IVPB 3.375 g  Status:  Discontinued     3.375 g 12.5 mL/hr over 240 Minutes Intravenous Every 8 hours 03/19/19 0028 03/19/19 0036   03/19/19 0200  piperacillin-tazobactam (ZOSYN) IVPB 3.375 g     3.375 g 12.5 mL/hr over 240 Minutes Intravenous Every 8 hours 03/19/19 0036 03/24/19 0659   03/18/19 2038  clindamycin (CLEOCIN) 900 mg, gentamicin (GARAMYCIN) 240 mg in sodium chloride 0.9 % 1,000 mL for intraperitoneal lavage  Status:  Discontinued       As needed 03/18/19 2038 03/18/19 2253   03/18/19 1845  ceFAZolin (ANCEF) IVPB 2g/100 mL premix     2 g 200 mL/hr over 30 Minutes Intravenous On call to O.R. 03/18/19 1747 03/18/19 1953   03/18/19 1845  metroNIDAZOLE (FLAGYL) IVPB 500 mg     500 mg 100 mL/hr over 60 Minutes Intravenous On call to O.R. 03/18/19 1747 03/18/19 1953   03/18/19 1830  clindamycin (CLEOCIN) 900 mg, gentamicin (GARAMYCIN) 240 mg in sodium chloride 0.9 % 1,000 mL for intraperitoneal lavage  Status:  Discontinued    Note to Pharmacy:  Have in the  OR room for final irrigation in bowel surgery case to minimize risk of abscess/infection Pharmacy may adjust dosing strength, schedule, rate of infusion, etc as needed to optimize therapy    Irrigation To Surgery 03/18/19 1828 03/19/19 0002   03/18/19 1806  metroNIDAZOLE (FLAGYL) 5-0.79 MG/ML-% IVPB    Note to Pharmacy:  [name]   : cabinet override      03/18/19 1806 03/18/19 1923   03/18/19 1806  ceFAZolin (ANCEF) 2-4 GM/100ML-% IVPB    Note to Pharmacy:  [name]   : cabinet override      03/18/19 1806 03/18/19 1923      Assessment/Plan:  POD 7 s/p laparoscopic BIH repairs with absorbable Phasix mesh with SBR for 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  -cont JP drain for now and follow, will remove prior to d/c -PT eval for mobilization, note recs for home health -will continue zosyn for now due to ct findings ARF- resolved, Cr0.88 Tachycardia -resolved tachypnea/dyspnea-echo and duplex  fine,medicine following AMS -resolved Hypokalemia-3.6today FEN -PICC, cont TPN today VTE -Lovenox, SCDs ID -Zosyn  updated daughter Harrel CarinaMaria Mclean [phone] on patient condition  Vanita Pandalicia C Skya Mccullum 03/27/2019

## 2019-03-27 NOTE — Progress Notes (Signed)
PROGRESS NOTE    Omar Bell  TKW:[id number] DOB: 03/23/1951 DOA: 03/18/2019 PCP: Benita Stabile, MD   Brief Narrative:  Cartavious Cuellaris an 68 y.o.malewith prior h/o hyperlipidemia, initially presented to ED with incarcerated leftinguinal hernia perforation of the jejunumcausingSBO, underwent laparoscopiclysis of adhesions, small bowel resection, lap bil inguinal and right femoral hernia repairs with absorbable mesh,by surgery on 03/18/2019.  Medical team was consulted for confusion, tachycardia tachypnea.   Assessment & Plan   Acute metabolic encephalopathy -resolved, appears to be AAOx3 -Ammonia, TSH and vitamin B12 within normal limits -Given that no neuro deficits, and encephalopathy appears to resolved, no indication for CT of the head at this time  Tachycardia and tachypnea-likely acute diastolic heart failure -Chest x-ray showed possible left lower lobe consolidation with bilateral pleural effusion -Likely secondary to fluid overload given significant positive fluid balance -Currently afebrile with no leukocytosis -Tachycardia has improved -Continue nebulizer treatments as needed -Patient did receive IV Lasix -Continue to monitor intake and output, daily weights -Echocardiogram EF 60 to 65%, LV diastolic Doppler parameters are consistent with impaired relaxation -Extremity Doppler unremarkable for DVT  Leukocytosis -Continues to increase- 18.4, however patient currently on Zosyn -CT abdomen pelvis showed possible developing abscess -General surgery discussed with interventional radiology, currently not drainable at this time.  Will likely need repeat CT scan in 3 to 4 days if not improved. -UA unremarkable for infection -Lung exam clear -currently afebrile -pending CBC this morning  Hypokalemia -Continue to monitor BMP and replace as needed  Incarcerated left inguinal hernia with gangrenous perforation of the jejunum and SBO -Per general surgery  Acute  anemia of blood loss -Secondary to surgery -Hemoglobin currently 10.8, was 14.80 on admission however suspect this is dilutional    -pending CBC, transfuse if hemoglobin below 7  Acute kidney injury -Resolved, continue to monitor BMP  Right bundle branch block -No complaints of chest pain, troponin negative on admission x2 right bundle branch block   DVT Prophylaxis  Lovenox   Code Status: Full  Family Communication: None at bedside  Disposition Plan: Admitted. 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 as well as right femoral hernia  Echocardiogram  Lower extremity doppler  Antibiotics   Anti-infectives (From admission, onward)   Start     Dose/Rate Route Frequency Ordered Stop   03/24/19 1200  piperacillin-tazobactam (ZOSYN) IVPB 3.375 g     3.375 g 12.5 mL/hr over 240 Minutes Intravenous Every 8 hours 03/24/19 1120 03/29/19 1359   03/19/19 0200  piperacillin-tazobactam (ZOSYN) IVPB 3.375 g  Status:  Discontinued     3.375 g 12.5 mL/hr over 240 Minutes Intravenous Every 8 hours 03/19/19 0028 03/19/19 0036   03/19/19 0200  piperacillin-tazobactam (ZOSYN) IVPB 3.375 g     3.375 g 12.5 mL/hr over 240 Minutes Intravenous Every 8 hours 03/19/19 0036 03/24/19 0659   03/18/19 2038  clindamycin (CLEOCIN) 900 mg, gentamicin (GARAMYCIN) 240 mg in sodium chloride 0.9 % 1,000 mL for intraperitoneal lavage  Status:  Discontinued       As needed 03/18/19 2038 03/18/19 2253   03/18/19 1845  ceFAZolin (ANCEF) IVPB 2g/100 mL premix     2 g 200 mL/hr over 30 Minutes Intravenous On call to O.R. 03/18/19 1747 03/18/19 1953   03/18/19 1845  metroNIDAZOLE (FLAGYL) IVPB 500 mg     500 mg 100 mL/hr over 60 Minutes  Intravenous On call to O.R. 03/18/19 1747 03/18/19 1953   03/18/19 1830  clindamycin (CLEOCIN) 900 mg, gentamicin (GARAMYCIN) 240  mg in sodium chloride 0.9 % 1,000 mL for intraperitoneal lavage  Status:  Discontinued    Note to Pharmacy:  Have in the  OR room for final irrigation in bowel surgery case to minimize risk of abscess/infection Pharmacy may adjust dosing strength, schedule, rate of infusion, etc as needed to optimize therapy    Irrigation To Surgery 03/18/19 1828 03/19/19 0002   03/18/19 1806  metroNIDAZOLE (FLAGYL) 5-0.79 MG/ML-% IVPB    Note to Pharmacy:  Marney DoctorMentley, Pamela   : cabinet override      03/18/19 1806 03/18/19 1923   03/18/19 1806  ceFAZolin (ANCEF) 2-4 GM/100ML-% IVPB    Note to Pharmacy:  Marney DoctorMentley, Pamela   : cabinet override      03/18/19 1806 03/18/19 1923      Subjective:   Omar Bell seen and examined today.  Feeling better today as compared to prior days. Denies chest pain or shortness of breath. Able to walk around some.  Objective:   Vitals:   03/26/19 0507 03/26/19 1350 03/26/19 2115 03/27/19 0542  BP: 125/74 120/67 121/77 129/77  Pulse: 90 76 92 (!) 101  Resp: 16 18 16 16   Temp: 98.8 F (37.1 C) 98.1 F (36.7 C) 98.9 F (37.2 C) 98 F (36.7 C)  TempSrc: Oral Oral Oral Oral  SpO2: 96% 98% 97% 95%  Weight:      Height:        Intake/Output Summary (Last 24 hours) at 03/27/2019 0858 Last data filed at 03/27/2019 0542 Gross per 24 hour  Intake 1493.52 ml  Output 980 ml  Net 513.52 ml   Filed Weights   03/18/19 1534  Weight: 72.6 kg   Exam  General: Well developed, chronically ill appearing, NAD  Neuro: AAOx3, nonfocal  Data Reviewed: I have personally reviewed following labs and imaging studies  CBC: Recent Labs  Lab 03/23/19 0509 03/24/19 0403 03/25/19 0608 03/26/19 0316 03/27/19 0807  WBC 10.9* 13.8* 15.6* 18.4* 19.4*  NEUTROABS  --   --  11.5*  --   --   HGB 10.5* 11.2* 10.8* 10.8* 11.1*  HCT 33.6* 34.9* 34.2* 33.1* 33.8*  MCV 96.6 95.4 95.8 93.8 93.9  PLT 177 205 323 418* 530*   Basic Metabolic Panel: Recent Labs  Lab 03/23/19 0509  03/24/19 0403 03/25/19 0608 03/26/19 0316 03/27/19 0408  NA 134* 135 134* 132* 131*  K 3.8 3.9 3.5 3.6 4.0  CL 103 104 104 102 102  CO2 24 22 21* 23 22  GLUCOSE 118* 118* 119* 141* 154*  BUN 22 24* 19 16 18   CREATININE 0.87 0.91 0.88 0.82 0.86  CALCIUM 7.5* 7.7* 7.6* 7.7* 7.7*  MG 2.1  --  1.9  --  2.1  PHOS  --   --  2.4*  --  2.9   GFR: Estimated Creatinine Clearance: 81.1 mL/min (by C-G formula based on SCr of 0.86 mg/dL). Liver Function Tests: Recent Labs  Lab 03/25/19 0608 03/27/19 0408  AST 34 34  ALT 27 30  ALKPHOS 138* 189*  BILITOT 1.2 1.0  PROT 5.6* 6.1*  ALBUMIN 2.0* 2.2*   No results for input(s): LIPASE, AMYLASE in the last 168 hours. Recent Labs  Lab 03/21/19 1401  AMMONIA 21   Coagulation Profile: No results for input(s): INR, PROTIME in the last 168 hours. Cardiac Enzymes: No results for input(s): CKTOTAL, CKMB, CKMBINDEX,  TROPONINI in the last 168 hours. BNP (last 3 results) No results for input(s): PROBNP in the last 8760 hours. HbA1C: No results for input(s): HGBA1C in the last 72 hours. CBG: Recent Labs  Lab 03/25/19 2355 03/26/19 1356 03/27/19 0006  GLUCAP 136* 116* 142*   Lipid Profile: Recent Labs    03/25/19 0608  TRIG 496*   Thyroid Function Tests: No results for input(s): TSH, T4TOTAL, FREET4, T3FREE, THYROIDAB in the last 72 hours. Anemia Panel: No results for input(s): VITAMINB12, FOLATE, FERRITIN, TIBC, IRON, RETICCTPCT in the last 72 hours. Urine analysis:    Component Value Date/Time   COLORURINE YELLOW 03/25/2019 2343   APPEARANCEUR CLEAR 03/25/2019 2343   LABSPEC 1.019 03/25/2019 2343   PHURINE 6.0 03/25/2019 2343   GLUCOSEU NEGATIVE 03/25/2019 2343   HGBUR SMALL (A) 03/25/2019 2343   BILIRUBINUR NEGATIVE 03/25/2019 2343   KETONESUR NEGATIVE 03/25/2019 2343   PROTEINUR 30 (A) 03/25/2019 2343   NITRITE NEGATIVE 03/25/2019 2343   LEUKOCYTESUR NEGATIVE 03/25/2019 2343   Sepsis Labs:  (procalcitonin:4,lacticidven:4)  )No results found for this or any previous visit (from the past 240 hour(s)).    Radiology Studies: No results found.   Scheduled Meds: . enoxaparin (LOVENOX) injection  40 mg Subcutaneous Q24H  . lip balm  1 application Topical BID  . metoprolol tartrate  2.5 mg Intravenous Q8H  . sodium chloride flush  10-40 mL Intracatheter Q12H   Continuous Infusions: . chlorproMAZINE (THORAZINE) IV    . dextrose 5 % and 0.45 % NaCl with KCl 20 mEq/L 10 mL/hr at 03/26/19 0645  . methocarbamol (ROBAXIN) IV    . ondansetron (ZOFRAN) IV    . piperacillin-tazobactam (ZOSYN)  IV 3.375 g (03/27/19 0538)  . TPN ADULT (ION) 60 mL/hr at 03/26/19 1724  . TPN ADULT (ION)       LOS: 9 days   Time Spent in minutes   15 minutes  Quintella Mura D.O. on 03/27/2019 at 8:58 AM  Between 7am to 7pm - Please see pager noted on [email or web address]  After 7pm go to [email or web address]  And look for the night coverage person covering for me after hours  [facility] Office  [phone]

## 2019-03-28 LAB — COMPREHENSIVE METABOLIC PANEL
ALT: 30 U/L (ref 0–44)
AST: 38 U/L (ref 15–41)
Albumin: 2 g/dL — ABNORMAL LOW (ref 3.5–5.0)
Alkaline Phosphatase: 193 U/L — ABNORMAL HIGH (ref 38–126)
Anion gap: 9 (ref 5–15)
BUN: 19 mg/dL (ref 8–23)
CO2: 22 mmol/L (ref 22–32)
Calcium: 7.9 mg/dL — ABNORMAL LOW (ref 8.9–10.3)
Chloride: 102 mmol/L (ref 98–111)
Creatinine, Ser: 0.74 mg/dL (ref 0.61–1.24)
GFR calc Af Amer: >60 mL/min (ref >60)
GFR calc non Af Amer: >60 mL/min (ref >60)
Glucose, Bld: 142 mg/dL — ABNORMAL HIGH (ref 70–99)
Potassium: 4.1 mmol/L (ref 3.5–5.1)
Sodium: 133 mmol/L — ABNORMAL LOW (ref 135–145)
Total Bilirubin: 0.8 mg/dL (ref 0.3–1.2)
Total Protein: 5.9 g/dL — ABNORMAL LOW (ref 6.5–8.1)

## 2019-03-28 LAB — DIFFERENTIAL
Abs Immature Granulocytes: 0.97 10*3/uL — ABNORMAL HIGH (ref 0.00–0.07)
Basophils Absolute: 0.1 10*3/uL (ref 0.0–0.1)
Basophils Relative: 1 %
Eosinophils Absolute: 0.1 10*3/uL (ref 0.0–0.5)
Eosinophils Relative: 1 %
Immature Granulocytes: 7 %
Lymphocytes Relative: 8 %
Lymphs Abs: 1.1 10*3/uL (ref 0.7–4.0)
Monocytes Absolute: 0.8 10*3/uL (ref 0.1–1.0)
Monocytes Relative: 6 %
Neutro Abs: 11.6 10*3/uL — ABNORMAL HIGH (ref 1.7–7.7)
Neutrophils Relative %: 77 %

## 2019-03-28 LAB — CBC
HCT: 31.3 % — ABNORMAL LOW (ref 39.0–52.0)
Hemoglobin: 10.2 g/dL — ABNORMAL LOW (ref 13.0–17.0)
MCH: 30.9 pg (ref 26.0–34.0)
MCHC: 32.6 g/dL (ref 30.0–36.0)
MCV: 94.8 fL (ref 80.0–100.0)
Platelets: 541 10*3/uL — ABNORMAL HIGH (ref 150–400)
RBC: 3.3 MIL/uL — ABNORMAL LOW (ref 4.22–5.81)
RDW: 13 % (ref 11.5–15.5)
WBC: 14.7 10*3/uL — ABNORMAL HIGH (ref 4.0–10.5)
nRBC: 0 % (ref 0.0–0.2)

## 2019-03-28 LAB — TRIGLYCERIDES: Triglycerides: 236 mg/dL — ABNORMAL HIGH (ref <150)

## 2019-03-28 LAB — GLUCOSE, CAPILLARY
Glucose-Capillary: 133 mg/dL — ABNORMAL HIGH (ref 70–99)
Glucose-Capillary: 142 mg/dL — ABNORMAL HIGH (ref 70–99)
Glucose-Capillary: 152 mg/dL — ABNORMAL HIGH (ref 70–99)

## 2019-03-28 LAB — PREALBUMIN: Prealbumin: 11.7 mg/dL — ABNORMAL LOW (ref 18–38)

## 2019-03-28 LAB — MAGNESIUM: Magnesium: 2 mg/dL (ref 1.7–2.4)

## 2019-03-28 LAB — PHOSPHORUS: Phosphorus: 2.8 mg/dL (ref 2.5–4.6)

## 2019-03-28 MED ORDER — TRAVASOL 10 % IV SOLN
INTRAVENOUS | Status: AC
  Administered 2019-03-28: 17:00:00 via INTRAVENOUS
  Filled 2019-03-28: qty 1188

## 2019-03-28 MED ORDER — TRAVASOL 10 % IV SOLN
INTRAVENOUS | Status: DC
  Filled 2019-03-28: qty 1188

## 2019-03-28 MED ORDER — TRAVASOL 10 % IV SOLN
INTRAVENOUS | Status: DC
  Filled 2019-03-28: qty 990

## 2019-03-28 NOTE — Progress Notes (Addendum)
PHARMACY - ADULT TOTAL PARENTERAL NUTRITION CONSULT NOTE   Pharmacy Consult for TPN Indication: Prolonged ileus  Patient Measurements: Height: 5\' 9"  (175.3 cm) Weight: 160 lb (72.6 kg) IBW/kg (Calculated) : 70.7 TPN AdjBW (KG): 72.6 Body mass index is 23.63 kg/m.  Current Nutrition: NPO  IVF: D5 1/2 NS with 20 KCl at 10 ml/hr  Central access: PICC to be placed 4/17 TPN start date: Plan 4/17  ASSESSMENT                                                                                        HPI: 68 yo male presents 03/18/19 with nausea & vomiting. He has a history of left inguinal hernia repair in 1991. He was taken for emergent to surgery for incarcerated leftinguinal hernia perforation of the jejunumcausingSBO, underwent laparoscopiclysis of adhesions, small bowel resection, lap bil inguinal and right femoral hernia repairs with absorbable mesh on 03/18/2019.  Significant events:   Insulin Requirements: n/a  Today, 03/28/19  Glucose: No hx DM, CBGs at goal < 150  Electrolytes: WNL except Na 133 up some after increasing Na amount in TPN  Renal: SCr wnl  LFTs: AST/ALT wnl, AlkPhos elevated and essentially unchanged  TGs: 496 (4/17) 236 (4/20) - note hx HLD - TGs much improved  Prealbumin: 7.4 (4/17), 11.7 (4/20)  NUTRITIONAL GOALS                                                                           RD recs (4/17): Kcal:  1031-2811 kcal Protein:  100-115 grams Fluid:  >/= 2 L/day  Custom TPN at goal rate of 90 ml/hr provides: -  119 g/day protein ( 55  g/L) -  497 g/day Dextrose ( 23 %) -  0 g/day Lipid  ( 0  G/L) - hold due to hypertriglyceridemia -  2164 Kcal/day  PLAN                                                                                                       At 1800 today:  Continue custom TPN and will increase rate from 75 ml/hr to goal 90 ml/hr - Note that TG's have greatly improved but will continue TPN without Lipids for now given such  drastic change in TG in only 3 days and will recheck TGs in a few days to confirm that they are still at current level/improving still and can likely add lipids back to TPN at that  time  Electrolytes in TPN: standard except increased Na and K, Cl:Ac ratio 1:2  TPN to contain standard MVI but to give trace elements only on MWF due to current national shortage  Continue IVF at 3610ml/hr.  Continue CBG checks q8h - add insulin coverage if needed.   TPN lab panels on Mondays & Thursdays.  F/u daily.   Hessie KnowsJustin M Erasto Sleight, PharmD, BCPS Pager [phone] 03/28/2019 8:09 AM

## 2019-03-28 NOTE — Progress Notes (Signed)
PROGRESS NOTE    Omar Bell  ZOX:[id number] DOB: [birth year] DOA: 03/18/2019 PCP: [name], [name], MD   Brief Narrative:  Omar Bell an 68 y.o.malewith prior h/o hyperlipidemia, initially presented to ED with incarcerated leftinguinal hernia perforation of the jejunumcausingSBO, underwent laparoscopiclysis of adhesions, small bowel resection, lap bil inguinal and right femoral hernia repairs with absorbable mesh,by surgery on 03/18/2019.  Medical team was consulted for confusion, tachycardia tachypnea.   Assessment & Plan   Acute metabolic encephalopathy -resolved, appears to be AAOx3 -Ammonia, TSH and vitamin B12 within normal limits -Given that no neuro deficits, and encephalopathy appears to resolved, no indication for CT of the head at this time  Tachycardia and tachypnea-likely acute diastolic heart failure -Chest x-ray showed possible left lower lobe consolidation with bilateral pleural effusion -Likely secondary to fluid overload given significant positive fluid balance -Currently afebrile with no leukocytosis -Tachycardia has improved -Continue nebulizer treatments as needed -Patient did receive IV Lasix -Continue to monitor intake and output, daily weights -Echocardiogram EF 60 to 65%, LV diastolic Doppler parameters are consistent with impaired relaxation -Extremity Doppler unremarkable for DVT  Leukocytosis -Improving, down to 14.7 -CT abdomen pelvis showed possible developing abscess -General surgery discussed with interventional radiology, currently not drainable at this time.  Will likely need repeat CT scan in 3 to 4 days if not improved. -UA unremarkable for infection -Lung exam clear -currently afebrile  Hypokalemia -Continue to monitor BMP and replace as needed  Incarcerated left inguinal hernia with gangrenous perforation of the jejunum and SBO -Per general surgery -Continue TPN  Acute anemia of blood loss -Secondary to surgery  -Hemoglobin currently 10.2, was 14.80 on admission however suspect this is dilutional    -pending CBC, transfuse if hemoglobin below 7  Acute kidney injury -Resolved, continue to monitor BMP  Right bundle branch block -No complaints of chest pain, troponin negative on admission x2 right bundle branch block   DVT Prophylaxis  Lovenox   Code Status: Full  Family Communication: None at bedside  Disposition Plan: Admitted. 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 as well as right femoral hernia  Echocardiogram  Lower extremity doppler  Antibiotics   Anti-infectives (From admission, onward)   Start     Dose/Rate Route Frequency Ordered Stop   03/24/19 1200  piperacillin-tazobactam (ZOSYN) IVPB 3.375 g     3.375 g 12.5 mL/hr over 240 Minutes Intravenous Every 8 hours 03/24/19 1120 03/29/19 1359   03/19/19 0200  piperacillin-tazobactam (ZOSYN) IVPB 3.375 g  Status:  Discontinued     3.375 g 12.5 mL/hr over 240 Minutes Intravenous Every 8 hours 03/19/19 0028 03/19/19 0036   03/19/19 0200  piperacillin-tazobactam (ZOSYN) IVPB 3.375 g     3.375 g 12.5 mL/hr over 240 Minutes Intravenous Every 8 hours 03/19/19 0036 03/24/19 0659   03/18/19 2038  clindamycin (CLEOCIN) 900 mg, gentamicin (GARAMYCIN) 240 mg in sodium chloride 0.9 % 1,000 mL for intraperitoneal lavage  Status:  Discontinued       As needed 03/18/19 2038 03/18/19 2253   03/18/19 1845  ceFAZolin (ANCEF) IVPB 2g/100 mL premix     2 g 200 mL/hr over 30 Minutes Intravenous On call to O.R. 03/18/19 1747 03/18/19 1953   03/18/19 1845  metroNIDAZOLE (FLAGYL) IVPB 500 mg     500 mg 100 mL/hr over 60 Minutes Intravenous On call to O.R. 03/18/19 1747  03/18/19 1953   03/18/19 1830  clindamycin (CLEOCIN) 900 mg, gentamicin (GARAMYCIN) 240 mg in sodium chloride 0.9 % 1,000 mL for  intraperitoneal lavage  Status:  Discontinued    Note to Pharmacy:  Have in the  OR room for final irrigation in bowel surgery case to minimize risk of abscess/infection Pharmacy may adjust dosing strength, schedule, rate of infusion, etc as needed to optimize therapy    Irrigation To Surgery 03/18/19 1828 03/19/19 0002   03/18/19 1806  metroNIDAZOLE (FLAGYL) 5-0.79 MG/ML-% IVPB    Note to Pharmacy:  [name]   : cabinet override      03/18/19 1806 03/18/19 1923   03/18/19 1806  ceFAZolin (ANCEF) 2-4 GM/100ML-% IVPB    Note to Pharmacy:  [name]   : cabinet override      03/18/19 1806 03/18/19 1923      Subjective:   Omar Bell seen and examined today.  Feels about the same.  Feels very tired.  Had a bowel movement this morning.  Patient was able to ambulate in the hallways yesterday.  Denies current chest pain, shortness of breath, vomiting, dizziness or headache. Objective:   Vitals:   03/27/19 1405 03/27/19 1612 03/27/19 2213 03/28/19 0524  BP: 123/72  113/65 121/65  Pulse: (!) 116 (!) 107 (!) 110 95  Resp: 16  16 18   Temp: 99.3 F (37.4 C)  99.5 F (37.5 C) 99.9 F (37.7 C)  TempSrc: Oral  Oral Oral  SpO2: 97% 99% 96% 96%  Weight:      Height:        Intake/Output Summary (Last 24 hours) at 03/28/2019 1006 Last data filed at 03/28/2019 1000 Gross per 24 hour  Intake 2063.35 ml  Output 1900 ml  Net 163.35 ml   Filed Weights   03/18/19 1534  Weight: 72.6 kg    Exam  General: Well developed, chronically ill appearing, NAD  HEENT: NCATmucous membranes moist.   Cardiovascular: S1 S2 auscultated, RRR  Respiratory: Clear to auscultation bilaterally  Abdomen: Soft,mildly TTP, nondistended, + bowel sounds, Drain in place  Extremities: warm dry without cyanosis clubbing or edema  Neuro: AAOx3, nonfocal  Psych: Flat, depressed   Data Reviewed: I have personally reviewed following labs and imaging studies  CBC: Recent Labs  Lab 03/24/19  0403 03/25/19 0608 03/26/19 0316 03/27/19 0807 03/28/19 0347  WBC 13.8* 15.6* 18.4* 19.4* 14.7*  NEUTROABS  --  11.5*  --   --  11.6*  HGB 11.2* 10.8* 10.8* 11.1* 10.2*  HCT 34.9* 34.2* 33.1* 33.8* 31.3*  MCV 95.4 95.8 93.8 93.9 94.8  PLT 205 323 418* 530* 541*   Basic Metabolic Panel: Recent Labs  Lab 03/23/19 0509 03/24/19 0403 03/25/19 0608 03/26/19 0316 03/27/19 0408 03/28/19 0347  NA 134* 135 134* 132* 131* 133*  K 3.8 3.9 3.5 3.6 4.0 4.1  CL 103 104 104 102 102 102  CO2 24 22 21* 23 22 22   GLUCOSE 118* 118* 119* 141* 154* 142*  BUN 22 24* 19 16 18 19   CREATININE 0.87 0.91 0.88 0.82 0.86 0.74  CALCIUM 7.5* 7.7* 7.6* 7.7* 7.7* 7.9*  MG 2.1  --  1.9  --  2.1 2.0  PHOS  --   --  2.4*  --  2.9 2.8   GFR: Estimated Creatinine Clearance: 87.1 mL/min (by C-G formula based on SCr of 0.74 mg/dL). Liver Function Tests: Recent Labs  Lab 03/25/19 0608 03/27/19 0408 03/28/19 0347  AST 34 34 38  ALT ALKPHOS 138* 189* 193*  BILITOT 1.2 1.0 0.8  PROT 5.6* 6.1* 5.9*  ALBUMIN 2.0* 2.2* 2.0*   No results for input(s): LIPASE, AMYLASE in the last 168 hours. Recent Labs  Lab 03/21/19 1401  AMMONIA 21   Coagulation Profile: No results for input(s): INR, PROTIME in the last 168 hours. Cardiac Enzymes: No results for input(s): CKTOTAL, CKMB, CKMBINDEX, TROPONINI in the last 168 hours. BNP (last 3 results) No results for input(s): PROBNP in the last 8760 hours. HbA1C: No results for input(s): HGBA1C in the last 72 hours. CBG: Recent Labs  Lab 03/27/19 0006 03/27/19 0800 03/27/19 1829 03/27/19 2354 03/28/19 0805  GLUCAP 142* 133* 141* 153* 142*   Lipid Profile: Recent Labs    03/28/19 0347  TRIG 236*   Thyroid Function Tests: No results for input(s): TSH, T4TOTAL, FREET4, T3FREE, THYROIDAB in the last 72 hours. Anemia Panel: No results for input(s): VITAMINB12, FOLATE, FERRITIN, TIBC, IRON, RETICCTPCT in the last 72 hours. Urine analysis:     Component Value Date/Time   COLORURINE YELLOW 03/25/2019 2343   APPEARANCEUR CLEAR 03/25/2019 2343   LABSPEC 1.019 03/25/2019 2343   PHURINE 6.0 03/25/2019 2343   GLUCOSEU NEGATIVE 03/25/2019 2343   HGBUR SMALL (A) 03/25/2019 2343   BILIRUBINUR NEGATIVE 03/25/2019 2343   KETONESUR NEGATIVE 03/25/2019 2343   PROTEINUR 30 (A) 03/25/2019 2343   NITRITE NEGATIVE 03/25/2019 2343   LEUKOCYTESUR NEGATIVE 03/25/2019 2343   Sepsis Labs: (procalcitonin:4,lacticidven:4)  )No results found for this or any previous visit (from the past 240 hour(s)).    Radiology Studies: No results found.   Scheduled Meds: . enoxaparin (LOVENOX) injection  40 mg Subcutaneous Q24H  . lip balm  1 application Topical BID  . metoprolol tartrate  2.5 mg Intravenous Q8H  . sodium chloride flush  10-40 mL Intracatheter Q12H   Continuous Infusions: . chlorproMAZINE (THORAZINE) IV 12.5 mg (03/27/19 1254)  . dextrose 5 % and 0.45 % NaCl with KCl 20 mEq/L 10 mL/hr at 03/26/19 0645  . methocarbamol (ROBAXIN) IV    . ondansetron (ZOFRAN) IV    . piperacillin-tazobactam (ZOSYN)  IV 3.375 g (03/28/19 0530)  . TPN ADULT (ION)       LOS: 10 days   Time Spent in minutes   15 minutes  Mariah Gerstenberger D.O. on 03/28/2019 at 10:06 AM  Between 7am to 7pm - Please see pager noted on [email or web address]  After 7pm go to [email or web address]  And look for the night coverage person covering for me after hours  [facility] Office  [phone]

## 2019-03-28 NOTE — Care Management Important Message (Signed)
Important Message  Patient DetailsName: Omar Bell IM letter given to Case Manager to present to the Patient MRN: [id number] Date of Birth: October 09, 1951   Medicare Important Message Given:  Yes    Caren Macadam 03/28/2019, 11:11 AM

## 2019-03-28 NOTE — Progress Notes (Addendum)
[facility] Progress Note  10 Days Post-Op  Subjective: CC-  Comfortable this morning. States that he feels about the same as yesterday. Tired and sore but reports controlled abdominal pain. Intermittent nausea, no emesis.  Last BM 2 days ago. Not passing flatus. Feels like he needs to have a BM this morning. He did ambulate yesterday.  WBC trending down 14.7, TMAX 99.9.  Objective: Vital signs in last 24 hours: Temp:  [99.3 F (37.4 C)-99.9 F (37.7 C)] 99.9 F (37.7 C) (04/20 0524) Pulse Rate:  [95-116] 95 (04/20 0524) Resp:  [16-18] 18 (04/20 0524) BP: (113-123)/(65-72) 121/65 (04/20 0524) SpO2:  [96 %-99 %] 96 % (04/20 0524) Last BM Date: 03/26/19  Intake/Output from previous day: 04/19 0701 - 04/20 0700 In: 821.2 [P.O.:180; I.V.:566.2; IV Piggyback:75] Out: 1770 [Urine:1750; Drains:20] Intake/Output this shift: No intake/output data recorded.  PE: Gen:  Alert, NAD HEENT: EOM's intact, pupils equal and round Card:  RRR Pulm:  CTAB, no W/R/R, effort normal Abd: Soft, nondistended, mild upper abdominal TTP, +BS, drain with serosanguinous output, incisions cdi without erythema or drainage Skin: warm and dry  Lab Results:  Recent Labs    03/27/19 0807 03/28/19 0347  WBC 19.4* 14.7*  HGB 11.1* 10.2*  HCT 33.8* 31.3*  PLT 530* 541*   BMET Recent Labs    03/27/19 0408 03/28/19 0347  NA 131* 133*  K 4.0 4.1  CL 102 102  CO2 22 22  GLUCOSE 154* 142*  BUN 18 19  CREATININE 0.86 0.74  CALCIUM 7.7* 7.9*   PT/INR No results for input(s): LABPROT, INR in the last 72 hours. CMP     Component Value Date/Time   NA 133 (L) 03/28/2019 0347   K 4.1 03/28/2019 0347   CL 102 03/28/2019 0347   CO2 22 03/28/2019 0347   GLUCOSE 142 (H) 03/28/2019 0347   BUN 19 03/28/2019 0347   CREATININE 0.74 03/28/2019 0347   CALCIUM 7.9 (L) 03/28/2019 0347   PROT 5.9 (L) 03/28/2019 0347   ALBUMIN 2.0 (L) 03/28/2019 0347   AST 38 03/28/2019 0347   ALT 30  03/28/2019 0347   ALKPHOS 193 (H) 03/28/2019 0347   BILITOT 0.8 03/28/2019 0347   GFRNONAA >60 03/28/2019 0347   GFRAA >60 03/28/2019 0347   Lipase     Component Value Date/Time   LIPASE 24 03/18/2019 1601       Studies/Results: No results found.  Anti-infectives: Anti-infectives (From admission, onward)   Start     Dose/Rate Route Frequency Ordered Stop   03/24/19 1200  piperacillin-tazobactam (ZOSYN) IVPB 3.375 g     3.375 g 12.5 mL/hr over 240 Minutes Intravenous Every 8 hours 03/24/19 1120 03/29/19 1359   03/19/19 0200  piperacillin-tazobactam (ZOSYN) IVPB 3.375 g  Status:  Discontinued     3.375 g 12.5 mL/hr over 240 Minutes Intravenous Every 8 hours 03/19/19 0028 03/19/19 0036   03/19/19 0200  piperacillin-tazobactam (ZOSYN) IVPB 3.375 g     3.375 g 12.5 mL/hr over 240 Minutes Intravenous Every 8 hours 03/19/19 0036 03/24/19 0659   03/18/19 2038  clindamycin (CLEOCIN) 900 mg, gentamicin (GARAMYCIN) 240 mg in sodium chloride 0.9 % 1,000 mL for intraperitoneal lavage  Status:  Discontinued       As needed 03/18/19 2038 03/18/19 2253   03/18/19 1845  ceFAZolin (ANCEF) IVPB 2g/100 mL premix     2 g 200 mL/hr over 30 Minutes Intravenous On call to O.R. 03/18/19 1747 03/18/19 1953   03/18/19 1845  metroNIDAZOLE (FLAGYL) IVPB 500 mg     500 mg 100 mL/hr over 60 Minutes Intravenous On call to O.R. 03/18/19 1747 03/18/19 1953   03/18/19 1830  clindamycin (CLEOCIN) 900 mg, gentamicin (GARAMYCIN) 240 mg in sodium chloride 0.9 % 1,000 mL for intraperitoneal lavage  Status:  Discontinued    Note to Pharmacy:  Have in the  OR room for final irrigation in bowel surgery case to minimize risk of abscess/infection Pharmacy may adjust dosing strength, schedule, rate of infusion, etc as needed to optimize therapy    Irrigation To Surgery 03/18/19 1828 03/19/19 0002   03/18/19 1806  metroNIDAZOLE (FLAGYL) 5-0.79 MG/ML-% IVPB    Note to Pharmacy:  Marney DoctorMentley, Pamela   : cabinet override       03/18/19 1806 03/18/19 1923   03/18/19 1806  ceFAZolin (ANCEF) 2-4 GM/100ML-% IVPB    Note to Pharmacy:  Marney DoctorMentley, Pamela   : cabinet override      03/18/19 1806 03/18/19 1923       Assessment/Plan S/p laparoscopic BIH repairs with absorbable Phasix mesh 4/10 Dr. 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   -cont JP drain for now and follow, will remove prior to d/c  - CT scan 4/16 reviewed with IR, nothing really to drain now, will keep on abx and likely if not better will need another ct scan early next week  ARF- resolved, Cr0.74 Tachycardia -resolved tachypnea/dyspnea-echo and duplex 4/14 fine, improving, medicine following AMS -resolved Hypokalemia-4.1today, resolved  FEN-PICC, cont TPN today VTE-Lovenox, SCDs ID-Zosyn 4/11>>day#10 Foley - none Follow up - Dr. Michaell CowingGross  Plan: WBC trending down, TMAX 99.9. Clinically patient is about the same. Continue current plan of care as above. Plan repeat CT scan 1-2 days if not improving.  Spoke with patient's son in law Clifton Custardaron [phone] (daughter Harrel CarinaMaria Mclean still sleeping) and updated on plan.   LOS: 10 days    Franne FortsBrooke A Meuth , [facility] 03/28/2019, 7:58 AM Pager: [phone]  Agree with above.  Frustrated by lack of progress.  He birps day and night, has not passed flatus, but did have a BM this AM.  He is distended with rare BS. JP output only 20 cc last 24 hours. WBC is slightly better.  Ovidio Kinavid Lashun Ramseyer, MD, [facility] Center[facility] Pager: [phone] Office phone:  [phone]

## 2019-03-28 NOTE — Progress Notes (Signed)
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  Progressing slowly.    Follow Up Recommendations  Home health PT;Supervision for mobility/OOB     Equipment Recommendations  Other (comment)(4 WW with walker with seat)    Recommendations for Other Services       Precautions / Restrictions Precautions Precautions: Fall Restrictions Weight Bearing Restrictions: No    Mobility  Bed Mobility Overal bed mobility: Needs Assistance Bed Mobility: Supine to Sit;Sit to Supine     Supine to sit: Supervision;Min guard Sit to supine: Min assist   General bed mobility comments: assist B LE up onto bed   Transfers Overall transfer level: Needs assistance Equipment used: 4-wheeled walker Transfers: Sit to/from Stand Sit to Stand: Supervision;Min guard Stand pivot transfers: Supervision;Min guard       General transfer comment: verbal cues for hand placement and brake use of rollator,with rest breaks  Ambulation/Gait Ambulation/Gait assistance: Min Emergency planning/management officerguard;Supervision Gait Distance (Feet): 400 Feet Assistive device: 4-wheeled walker Gait Pattern/deviations: Step-through pattern;Trunk flexed;Decreased stride length Gait velocity: decreased    General Gait Details: required 4 seated rest breaks sitting on Rollator seat, pt c/o pain and fatigue   Stairs             Wheelchair Mobility    Modified Rankin (Stroke Patients Only)       Balance                                            Cognition Arousal/Alertness:  Awake/alert Behavior During Therapy: WFL for tasks assessed/performed Overall Cognitive Status: Within Functional Limits for tasks assessed                                 General Comments: required increased time      Exercises      General Comments        Pertinent Vitals/Pain Pain Assessment: Faces Faces Pain Scale: Hurts a little bit Pain Descriptors / Indicators: Discomfort;Grimacing;Tender Pain Intervention(s): Monitored during session    Home Living                      Prior Function            PT Goals (current goals can now be found in the care plan section) Progress towards PT goals: Progressing toward goals    Frequency    Min 3X/week      PT Plan Current plan remains appropriate    Co-evaluation              AM-PAC PT "6 Clicks" Mobility   Outcome Measure  Help needed turning from your back to your side while in a flat bed without using bedrails?: A Little Help needed moving from lying on your back to sitting on the side of a flat bed without using bedrails?: A Little Help needed moving to and from a  bed to a chair (including a wheelchair)?: A Little Help needed standing up from a chair using your arms (e.g., wheelchair or bedside chair)?: A Little Help needed to walk in hospital room?: A Little Help needed climbing 3-5 steps with a railing? : A Little 6 Click Score: 18    End of Session Equipment Utilized During Treatment: Gait belt Activity Tolerance: Patient limited by fatigue Patient left: in bed;with call bell/phone within reach;with bed alarm set Nurse Communication: Mobility status PT Visit Diagnosis: Other abnormalities of gait and mobility (R26.89)     Time: 7681-1572 PT Time Calculation (min) (ACUTE ONLY): 40 min  Charges:  $Gait Training: 23-37 mins $Therapeutic Activity: 8-22 mins                     [name]  PTA Acute  Rehabilitation Services Pager      [phone] Office       [phone]

## 2019-03-29 LAB — COMPREHENSIVE METABOLIC PANEL
ALT: 33 U/L (ref 0–44)
AST: 33 U/L (ref 15–41)
Albumin: 2.1 g/dL — ABNORMAL LOW (ref 3.5–5.0)
Alkaline Phosphatase: 198 U/L — ABNORMAL HIGH (ref 38–126)
Anion gap: 8 (ref 5–15)
BUN: 22 mg/dL (ref 8–23)
CO2: 22 mmol/L (ref 22–32)
Calcium: 7.8 mg/dL — ABNORMAL LOW (ref 8.9–10.3)
Chloride: 102 mmol/L (ref 98–111)
Creatinine, Ser: 0.85 mg/dL (ref 0.61–1.24)
GFR calc Af Amer: >60 mL/min (ref >60)
GFR calc non Af Amer: >60 mL/min (ref >60)
Glucose, Bld: 139 mg/dL — ABNORMAL HIGH (ref 70–99)
Potassium: 3.9 mmol/L (ref 3.5–5.1)
Sodium: 132 mmol/L — ABNORMAL LOW (ref 135–145)
Total Bilirubin: 0.8 mg/dL (ref 0.3–1.2)
Total Protein: 6.1 g/dL — ABNORMAL LOW (ref 6.5–8.1)

## 2019-03-29 LAB — CBC
HCT: 31.1 % — ABNORMAL LOW (ref 39.0–52.0)
Hemoglobin: 10.1 g/dL — ABNORMAL LOW (ref 13.0–17.0)
MCH: 30.8 pg (ref 26.0–34.0)
MCHC: 32.5 g/dL (ref 30.0–36.0)
MCV: 94.8 fL (ref 80.0–100.0)
Platelets: 659 10*3/uL — ABNORMAL HIGH (ref 150–400)
RBC: 3.28 MIL/uL — ABNORMAL LOW (ref 4.22–5.81)
RDW: 12.8 % (ref 11.5–15.5)
WBC: 13.9 10*3/uL — ABNORMAL HIGH (ref 4.0–10.5)
nRBC: 0 % (ref 0.0–0.2)

## 2019-03-29 LAB — GLUCOSE, CAPILLARY
Glucose-Capillary: 136 mg/dL — ABNORMAL HIGH (ref 70–99)
Glucose-Capillary: 139 mg/dL — ABNORMAL HIGH (ref 70–99)
Glucose-Capillary: 157 mg/dL — ABNORMAL HIGH (ref 70–99)

## 2019-03-29 MED ORDER — PRO-STAT SUGAR FREE PO LIQD
30.0000 mL | Freq: Two times a day (BID) | ORAL | Status: DC
  Administered 2019-03-29 – 2019-04-01 (×5): 30 mL via ORAL
  Filled 2019-03-29 (×5): qty 30

## 2019-03-29 MED ORDER — TRAVASOL 10 % IV SOLN
INTRAVENOUS | Status: AC
  Administered 2019-03-29: 17:00:00 via INTRAVENOUS
  Filled 2019-03-29: qty 1188

## 2019-03-29 MED ORDER — PIPERACILLIN-TAZOBACTAM 3.375 G IVPB
3.3750 g | Freq: Three times a day (TID) | INTRAVENOUS | Status: DC
  Administered 2019-03-29 – 2019-03-31 (×6): 3.375 g via INTRAVENOUS
  Filled 2019-03-29 (×6): qty 50

## 2019-03-29 MED ORDER — BOOST / RESOURCE BREEZE PO LIQD CUSTOM
1.0000 | Freq: Two times a day (BID) | ORAL | Status: DC
  Administered 2019-03-29 – 2019-04-01 (×5): 1 via ORAL

## 2019-03-29 MED ORDER — POLYETHYLENE GLYCOL 3350 17 G PO PACK
17.0000 g | PACK | Freq: Once | ORAL | Status: AC
  Administered 2019-03-31: 17 g via ORAL
  Filled 2019-03-29: qty 1

## 2019-03-29 MED ORDER — HYDROMORPHONE HCL 1 MG/ML IJ SOLN: 0.5000 mg | INTRAMUSCULAR | Status: DC | PRN

## 2019-03-29 NOTE — Progress Notes (Signed)
PHARMACY - ADULT TOTAL PARENTERAL NUTRITION CONSULT NOTE   Pharmacy Consult for TPN Indication: Prolonged ileus  Patient Measurements: Height: 5\' 9"  (175.3 cm) Weight: 160 lb (72.6 kg) IBW/kg (Calculated) : 70.7 TPN AdjBW (KG): 72.6 Body mass index is 23.63 kg/m.  Current Nutrition: Clear liquid diet  IVF: D5 1/2 NS with 20 KCl at 10 mL/hr  Central access: Double lumen PICC placed 4/17 TPN start date: 4/17  ASSESSMENT                                                                                        HPI: 68 yo male presents 03/18/19 with nausea & vomiting. He has a history of left inguinal hernia repair in 1991. He was taken for emergent to surgery for incarcerated leftinguinal hernia perforation of the jejunumcausingSBO, underwent laparoscopiclysis of adhesions, small bowel resection, lap bil inguinal and right femoral hernia repairs with absorbable mesh on 03/18/2019.  Significant events:  -4/10: Surgery -4/17: TPN started -4/21: Diet advanced to clear liquid  Insulin Requirements: n/a  Today, 03/29/19  Glucose: No hx DM, CBGs at goal < 150. No SSI.  Electrolytes: WNL except Na 132 remains slightly low despite increased Na amount in TPN  Renal: SCr WNL and stable  LFTs: AST/ALT WNL, AlkPhos elevated but stable  TGs: 496 (4/17) 236 (4/20) - note hx HLD - TGs much improved  Prealbumin: 7.4 (4/17), 11.7 (4/20) - improving  NUTRITIONAL GOALS                                                                           RD recs (4/21): Kcal:  [id number] kcal Protein:  100-115 grams Fluid:  >/= 2 L/day  Custom TPN at goal rate of 90 mL/hr provides: -  119 g/day protein ( 55  g/L) -  497 g/day Dextrose ( 23 %) -  0 g/day Lipid  ( 0  G/L) - hold due to hypertriglyceridemia -  2164 Kcal/day  PLAN                                                                                                       At 1800 today:  Continue custom TPN at goal rate of 90 mL/hr -Note  that TG's have greatly improved but will continue TPN without lipids for now given such drastic change in TG in only 3 days and will recheck TGs in a few days to confirm that they are still at current  level/improving still and can likely add lipids back to TPN at that time  Electrolytes in TPN: no changes. Continue standard concentration except increased Na and K, Cl:Ac ratio 1:2  TPN to contain standard MVI but to give trace elements only on MWF due to current national shortage  Continue IVF at 5ml/hr per MD  Continue CBG checks q8h - add insulin coverage if needed.   TPN lab panels on Mondays & Thursdays.  F/u daily.  Cindi Carbon, PharmD 03/29/19 10:51 AM

## 2019-03-29 NOTE — Plan of Care (Signed)
  Problem: Activity: Goal: Risk for activity intolerance will decrease Outcome: Progressing   Problem: Coping: Goal: Level of anxiety will decrease Outcome: Progressing   Problem: Pain Managment: Goal: General experience of comfort will improve Outcome: Progressing   

## 2019-03-29 NOTE — Progress Notes (Signed)
I called and spoke with patient's daughter Harrel Carina [phone] updated her on her father's progress.  Franne Forts, [facility] Surgery 03/29/2019, 12:18 PM Pager: [phone] Mon 7:00 am -11:30 AM Tues-Fri 7:00 am-4:30 pm Sat-Sun 7:00 am-11:30 am

## 2019-03-29 NOTE — Progress Notes (Signed)
PROGRESS NOTE    Omar Bell  [id number] DOB: 30-Apr-1951 DOA: 03/18/2019 PCP: [name], [name], MD   Brief Narrative:  Omar Bell an 68 y.o.malewith prior h/o hyperlipidemia, initially presented to ED with incarcerated leftinguinal hernia perforation of the jejunumcausingSBO, underwent laparoscopiclysis of adhesions, small bowel resection, lap bil inguinal and right femoral hernia repairs with absorbable mesh,by surgery on 03/18/2019.  Medical team was consulted for confusion, tachycardia tachypnea.  Patient appears to be improving slowly. TRH will sign off.  Assessment & Plan   Acute metabolic encephalopathy -resolved, appears to be AAOx3 -Ammonia, TSH and vitamin B12 within normal limits -Given that no neuro deficits, and encephalopathy appears to resolved, no indication for CT of the head at this time  Tachycardia and tachypnea-likely acute diastolic heart failure -Chest x-ray showed possible left lower lobe consolidation with bilateral pleural effusion -Likely secondary to fluid overload given significant positive fluid balance -Currently afebrile with no leukocytosis -Tachycardia has improved -Continue nebulizer treatments as needed -Patient did receive IV Lasix -Continue to monitor intake and output, daily weights -Echocardiogram EF 60 to 65%, LV diastolic Doppler parameters are consistent with impaired relaxation -Extremity Doppler unremarkable for DVT  Leukocytosis -Improving, down to 13.9 -CT abdomen pelvis showed possible developing abscess -General surgery discussed with interventional radiology, currently not drainable at this time.  Will likely need repeat CT scan in 3 to 4 days if not improved. -Discussed with general surgery, plan for repeat CT scan on 03/30/2019 -UA unremarkable for infection -Lung exam clear -currently afebrile  Hypokalemia -Continue to monitor BMP and replace as needed  Incarcerated left inguinal hernia with gangrenous  perforation of the jejunum and SBO -Per general surgery -Continue TPN -planning to start clears today  Acute anemia of blood loss -Secondary to surgery -Hemoglobin currently 10.1, was 14.80 on admission however suspect this is dilutional    -pending CBC, transfuse if hemoglobin below 7  Acute kidney injury -Resolved, continue to monitor BMP  Right bundle branch block -No complaints of chest pain, troponin negative on admission x2 right bundle branch block   DVT Prophylaxis  Lovenox   Code Status: Full  Family Communication: None at bedside  Disposition Plan: Admitted. Dispo per surgery. Discussed with Dr. Dwain SarnaWakefield, [facility] will sign off, please reconsult if needed.   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 as well as right femoral hernia  Echocardiogram  Lower extremity doppler  Antibiotics   Anti-infectives (From admission, onward)   Start     Dose/Rate Route Frequency Ordered Stop   03/24/19 1200  piperacillin-tazobactam (ZOSYN) IVPB 3.375 g     3.375 g 12.5 mL/hr over 240 Minutes Intravenous Every 8 hours 03/24/19 1120 03/29/19 1007   03/19/19 0200  piperacillin-tazobactam (ZOSYN) IVPB 3.375 g  Status:  Discontinued     3.375 g 12.5 mL/hr over 240 Minutes Intravenous Every 8 hours 03/19/19 0028 03/19/19 0036   03/19/19 0200  piperacillin-tazobactam (ZOSYN) IVPB 3.375 g     3.375 g 12.5 mL/hr over 240 Minutes Intravenous Every 8 hours 03/19/19 0036 03/24/19 0659   03/18/19 2038  clindamycin (CLEOCIN) 900 mg, gentamicin (GARAMYCIN) 240 mg in sodium chloride 0.9 % 1,000 mL for intraperitoneal lavage  Status:  Discontinued       As needed 03/18/19 2038 03/18/19 2253   03/18/19 1845  ceFAZolin (ANCEF) IVPB 2g/100 mL premix     2 g 200 mL/hr over  30 Minutes Intravenous On call to O.R. 03/18/19 1747 03/18/19 1953   03/18/19 1845   metroNIDAZOLE (FLAGYL) IVPB 500 mg     500 mg 100 mL/hr over 60 Minutes Intravenous On call to O.R. 03/18/19 1747 03/18/19 1953   03/18/19 1830  clindamycin (CLEOCIN) 900 mg, gentamicin (GARAMYCIN) 240 mg in sodium chloride 0.9 % 1,000 mL for intraperitoneal lavage  Status:  Discontinued    Note to Pharmacy:  Have in the  OR room for final irrigation in bowel surgery case to minimize risk of abscess/infection Pharmacy may adjust dosing strength, schedule, rate of infusion, etc as needed to optimize therapy    Irrigation To Surgery 03/18/19 1828 03/19/19 0002   03/18/19 1806  metroNIDAZOLE (FLAGYL) 5-0.79 MG/ML-% IVPB    Note to Pharmacy:  [name]   : cabinet override      03/18/19 1806 03/18/19 1923   03/18/19 1806  ceFAZolin (ANCEF) 2-4 GM/100ML-% IVPB    Note to Pharmacy:  [name]   : cabinet override      03/18/19 1806 03/18/19 1923      Subjective:   Omar Bell seen and examined today.  Was able to have 2 bowel movements yesterday. Gets very tired with movement. Denies chest pain or shortness of breath. Continues to have some abdominal bloating.  Objective:   Vitals:   03/28/19 0524 03/28/19 1341 03/28/19 2131 03/29/19 0452  BP: 121/65 125/73 109/67 (!) 141/73  Pulse: 95 94 100 94  Resp: 18 18 19 18   Temp: 99.9 F (37.7 C) 98.5 F (36.9 C) 98.6 F (37 C) 98 F (36.7 C)  TempSrc: Oral Oral Oral Oral  SpO2: 96% 97% 95% 97%  Weight:      Height:        Intake/Output Summary (Last 24 hours) at 03/29/2019 1048 Last data filed at 03/29/2019 1000 Gross per 24 hour  Intake 310 ml  Output 1580 ml  Net -1270 ml   Filed Weights   03/18/19 1534  Weight: 72.6 kg   Exam  General: Well developed, well nourished, NAD, appears stated age  HEENT: NCAT, mucous membranes moist.    Data Reviewed: I have personally reviewed following labs and imaging studies  CBC: Recent Labs  Lab 03/25/19 0608 03/26/19 0316 03/27/19 0807 03/28/19 0347 03/29/19 0422   WBC 15.6* 18.4* 19.4* 14.7* 13.9*  NEUTROABS 11.5*  --   --  11.6*  --   HGB 10.8* 10.8* 11.1* 10.2* 10.1*  HCT 34.2* 33.1* 33.8* 31.3* 31.1*  MCV 95.8 93.8 93.9 94.8 94.8  PLT 323 418* 530* 541* 659*   Basic Metabolic Panel: Recent Labs  Lab 03/23/19 0509  03/25/19 0608 03/26/19 0316 03/27/19 0408 03/28/19 0347 03/29/19 0422  NA 134*   < > 134* 132* 131* 133* 132*  K 3.8   < > 3.5 3.6 4.0 4.1 3.9  CL 103   < > 104 102 102 102 102  CO2 24   < > 21* 23 22 22 22   GLUCOSE 118*   < > 119* 141* 154* 142* 139*  BUN 22   < > 19 16 18 19 22   CREATININE 0.87   < > 0.88 0.82 0.86 0.74 0.85  CALCIUM 7.5*   < > 7.6* 7.7* 7.7* 7.9* 7.8*  MG 2.1  --  1.9  --  2.1 2.0  --   PHOS  --   --  2.4*  --  2.9 2.8  --    < > =  values in this interval not displayed.   GFR: Estimated Creatinine Clearance: 82 mL/min (by C-G formula based on SCr of 0.85 mg/dL). Liver Function Tests: Recent Labs  Lab 03/25/19 0608 03/27/19 0408 03/28/19 0347 03/29/19 0422  AST 34 34 38 33  ALT 33  ALKPHOS 138* 189* 193* 198*  BILITOT 1.2 1.0 0.8 0.8  PROT 5.6* 6.1* 5.9* 6.1*  ALBUMIN 2.0* 2.2* 2.0* 2.1*   No results for input(s): LIPASE, AMYLASE in the last 168 hours. No results for input(s): AMMONIA in the last 168 hours. Coagulation Profile: No results for input(s): INR, PROTIME in the last 168 hours. Cardiac Enzymes: No results for input(s): CKTOTAL, CKMB, CKMBINDEX, TROPONINI in the last 168 hours. BNP (last 3 results) No results for input(s): PROBNP in the last 8760 hours. HbA1C: No results for input(s): HGBA1C in the last 72 hours. CBG: Recent Labs  Lab 03/27/19 2354 03/28/19 0805 03/28/19 1642 03/29/19 0055 03/29/19 0724  GLUCAP 153* 142* 152* 157* 139*   Lipid Profile: Recent Labs    03/28/19 0347  TRIG 236*   Thyroid Function Tests: No results for input(s): TSH, T4TOTAL, FREET4, T3FREE, THYROIDAB in the last 72 hours. Anemia Panel: No results for input(s): VITAMINB12,  FOLATE, FERRITIN, TIBC, IRON, RETICCTPCT in the last 72 hours. Urine analysis:    Component Value Date/Time   COLORURINE YELLOW 03/25/2019 2343   APPEARANCEUR CLEAR 03/25/2019 2343   LABSPEC 1.019 03/25/2019 2343   PHURINE 6.0 03/25/2019 2343   GLUCOSEU NEGATIVE 03/25/2019 2343   HGBUR SMALL (A) 03/25/2019 2343   BILIRUBINUR NEGATIVE 03/25/2019 2343   KETONESUR NEGATIVE 03/25/2019 2343   PROTEINUR 30 (A) 03/25/2019 2343   NITRITE NEGATIVE 03/25/2019 2343   LEUKOCYTESUR NEGATIVE 03/25/2019 2343   Sepsis Labs: (procalcitonin:4,lacticidven:4)  )No results found for this or any previous visit (from the past 240 hour(s)).    Radiology Studies: No results found.   Scheduled Meds: . enoxaparin (LOVENOX) injection  40 mg Subcutaneous Q24H  . feeding supplement  1 Container Oral BID BM  . feeding supplement (PRO-STAT SUGAR FREE 64)  30 mL Oral BID  . lip balm  1 application Topical BID  . metoprolol tartrate  2.5 mg Intravenous Q8H  . polyethylene glycol  17 g Oral Once  . sodium chloride flush  10-40 mL Intracatheter Q12H   Continuous Infusions: . dextrose 5 % and 0.45 % NaCl with KCl 20 mEq/L 10 mL/hr at 03/26/19 0645  . methocarbamol (ROBAXIN) IV    . ondansetron (ZOFRAN) IV    . TPN ADULT (ION) 90 mL/hr at 03/28/19 1720     LOS: 11 days   Time Spent in minutes   15 minutes  Camdyn Laden D.O. on 03/29/2019 at 10:48 AM  Between 7am to 7pm - Please see pager noted on [email or web address]  After 7pm go to [email or web address]  And look for the night coverage person covering for me after hours  [facility] Office  [phone]

## 2019-03-29 NOTE — Progress Notes (Signed)
11 Days Post-Op   Subjective/Chief Complaint: Two bms, feels less bloated, no n/v, still with some hiccups, ambulating   Objective: Vital signs in last 24 hours: Temp:  [98 F (36.7 C)-98.6 F (37 C)] 98 F (36.7 C) (04/21 0452) Pulse Rate:  [94-100] 94 (04/21 0452) Resp:  [18-19] 18 (04/21 0452) BP: (109-141)/(67-73) 141/73 (04/21 0452) SpO2:  [95 %-97 %] 97 % (04/21 0452) Last BM Date: 03/28/19  Intake/Output from previous day: 04/20 0701 - 04/21 0700 In: 1455.2 [I.V.:1143.1; IV Piggyback:312.1] Out: 1610 [Urine:1550; Drains:60] Intake/Output this shift: No intake/output data recorded.  Gen:  Alert CV:  RRR Pulm:  clear bilaterally Abd: Soft, nondistended, nontender +BS, drain with serosanguinous output, incisions cdi without erythema or drainage  Lab Results:  Recent Labs    03/28/19 0347 03/29/19 0422  WBC 14.7* 13.9*  HGB 10.2* 10.1*  HCT 31.3* 31.1*  PLT 541* 659*   BMET Recent Labs    03/28/19 0347 03/29/19 0422  NA 133* 132*  K 4.1 3.9  CL 102 102  CO2 22 22  GLUCOSE 142* 139*  BUN 19 22  CREATININE 0.74 0.85  CALCIUM 7.9* 7.8*   PT/INR No results for input(s): LABPROT, INR in the last 72 hours. ABG No results for input(s): PHART, HCO3 in the last 72 hours.  Invalid input(s): PCO2, PO2  Studies/Results: No results found.  Anti-infectives: Anti-infectives (From admission, onward)   Start     Dose/Rate Route Frequency Ordered Stop   03/24/19 1200  piperacillin-tazobactam (ZOSYN) IVPB 3.375 g     3.375 g 12.5 mL/hr over 240 Minutes Intravenous Every 8 hours 03/24/19 1120 03/29/19 1359   03/19/19 0200  piperacillin-tazobactam (ZOSYN) IVPB 3.375 g  Status:  Discontinued     3.375 g 12.5 mL/hr over 240 Minutes Intravenous Every 8 hours 03/19/19 0028 03/19/19 0036   03/19/19 0200  piperacillin-tazobactam (ZOSYN) IVPB 3.375 g     3.375 g 12.5 mL/hr over 240 Minutes Intravenous Every 8 hours 03/19/19 0036 03/24/19 0659   03/18/19 2038   clindamycin (CLEOCIN) 900 mg, gentamicin (GARAMYCIN) 240 mg in sodium chloride 0.9 % 1,000 mL for intraperitoneal lavage  Status:  Discontinued       As needed 03/18/19 2038 03/18/19 2253   03/18/19 1845  ceFAZolin (ANCEF) IVPB 2g/100 mL premix     2 g 200 mL/hr over 30 Minutes Intravenous On call to O.R. 03/18/19 1747 03/18/19 1953   03/18/19 1845  metroNIDAZOLE (FLAGYL) IVPB 500 mg     500 mg 100 mL/hr over 60 Minutes Intravenous On call to O.R. 03/18/19 1747 03/18/19 1953   03/18/19 1830  clindamycin (CLEOCIN) 900 mg, gentamicin (GARAMYCIN) 240 mg in sodium chloride 0.9 % 1,000 mL for intraperitoneal lavage  Status:  Discontinued    Note to Pharmacy:  Have in the  OR room for final irrigation in bowel surgery case to minimize risk of abscess/infection Pharmacy may adjust dosing strength, schedule, rate of infusion, etc as needed to optimize therapy    Irrigation To Surgery 03/18/19 1828 03/19/19 0002   03/18/19 1806  metroNIDAZOLE (FLAGYL) 5-0.79 MG/ML-% IVPB    Note to Pharmacy:  [name]   : cabinet override      03/18/19 1806 03/18/19 1923   03/18/19 1806  ceFAZolin (ANCEF) 2-4 GM/100ML-% IVPB    Note to Pharmacy:  [name]   : cabinet override      03/18/19 1806 03/18/19 1923      Assessment/Plan: POD 11 S/p laparoscopic BIH repairs  with absorbable Phasix mesh 4/10 Dr. 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              -cont JP drain for now and follow, will remove prior to d/c             - CT scan4/16 reviewed withIR, nothing really to drain now, will keep on abx and will plan repeat ct scan tomorrow ARF- resolved, Cr0.85 Tachycardia -resolved tachypnea/dyspnea-echo and duplex 4/14 fine, improving, medicine signing off, resolved AMS -resolved Hypokalemia-3.9today, resolved FEN-PICC, contTPN today, will  Let him try some clears  today VTE-Lovenox, SCDs ID-Zosyn 4/11>>day#11 Foley - none Follow up - Dr. Michaell CowingGross Contact: [phone] (daughter Omar Bell)- attempted to call this am with no answer    Omar Bell 03/29/2019

## 2019-03-29 NOTE — Progress Notes (Signed)
Nutrition Follow-up  RD working remotely.   DOCUMENTATION CODES:   Not applicable  INTERVENTION:  - will order Boost Breeze BID, each supplement provides 250 kcal and 9 grams of protein. - will order 30 mL Prostat BID, each supplement provides 100 kcal and 15 grams of protein. - continue to advance diet as medically feasible. - continue TPN per Pharmacy. - weigh patient today.    NUTRITION DIAGNOSIS:   Inadequate oral intake related to inability to eat as evidenced by NPO status. -diet advanced ~1 hour ago  GOAL:   Patient will meet greater than or equal to 90% of their needs -met with TPN.  MONITOR:   PO intake, Supplement acceptance, Diet advancement, Labs, Weight trends, Other (Comment)(TPN regimen)  ASSESSMENT:   68 year old male who has not sought medical treatment in some time. He reported L inguinal hernia repair in 1991 with recurring bulging ~10 years ago. Patient reported it is usually reducible not particularly bothersome; he can usually massage it back in. Patient was admitted on 4/10 d/t worsening abdominal pain x2 days with constipation and no BM during that time. Patient had also reported progressive N/V.  No weight since admission (4/10). Diet advanced from NPO to CLD today at 8:30 AM with no intakes yet. Will order ONS as outlined above. Patient with double lumen PICC (placed 4/17) and is receiving custom TPN. TPN increased from 75 ml/hr to goal rate of 90 ml/hr yesterday. Goal rate TPN provides 2164 kcal, 119 grams protein.   Per Dr. Cristal Generous note this AM: patient with less abdominal distention/bloating, no N/V, patient is able to ambulate. Plan to continue TPN and to repeat CT on 4/22. Patient is now POD #11 lap bilateral repairs with absorbable mesh, small bowel resection for recurrent incarcerated L inguinal hernia with gangrenous perforation of jejunum and SBO + R femoral hernia.     Medications reviewed; 17 g miralax x1 dose 4/21,  Labs reviewed; CBGs:  157 and 139 mg/dl today, Na: 132 mmol/l, Ca: 7.8 mg/dl, Alk Phos elevated.  IVF; D5-1/2 NS-20 mEq IV KCl @ 10 ml/hr (41 kcal).    Diet Order:   Diet Order            Diet clear liquid Room service appropriate? Yes; Fluid consistency: Thin  Diet effective now              EDUCATION NEEDS:   Not appropriate for education at this time  Skin:  Skin Assessment: Skin Integrity Issues: Skin Integrity Issues:: Incisions Incisions: abdomen (4/10)  Last BM:  4/20  Height:   Ht Readings from Last 1 Encounters:  03/18/19 '5\' 9"'$  (1.753 m)    Weight:   Wt Readings from Last 1 Encounters:  03/18/19 72.6 kg    Ideal Body Weight:  72.73 kg  BMI:  Body mass index is 23.63 kg/m.  Estimated Nutritional Needs:   Kcal:  6701-4103 kcal  Protein:  100-115 grams  Fluid:  >/= 2 L/day     Jarome Matin, MS, RD, LDN, [facility] Inpatient Clinical Dietitian Pager # [phone] After hours/weekend pager # [phone]

## 2019-03-29 NOTE — Progress Notes (Signed)
Pharmacy Antibiotic Note  Omar Bell is a 68 y.o. male admitted on 03/18/2019 and underwent surgery.  Pharmacy has been consulted for piperacillin/tazobactam dosing.  Patient is POD #11 and has been on piperacillin/tazobactam since 03/18/19.   Today, 03/29/19 -WBC 13.9, slightly elevated - SCr 0.85, CtCl ~80 mL/min - Day #11 antibiotics  Plan: Continue piperacillin/tazobactam 3.375 gIV q8h EI  Height: 5\' 9"  (175.3 cm) Weight: 160 lb (72.6 kg) IBW/kg (Calculated) : 70.7  Temp (24hrs), Avg:98.4 F (36.9 C), Min:98 F (36.7 C), Max:98.6 F (37 C)  Recent Labs  Lab 03/25/19 0608 03/26/19 0316 03/27/19 0408 03/27/19 0807 03/28/19 0347 03/29/19 0422  WBC 15.6* 18.4*  --  19.4* 14.7* 13.9*  CREATININE 0.88 0.82 0.86  --  0.74 0.85    Estimated Creatinine Clearance: 82 mL/min (by C-G formula based on SCr of 0.85 mg/dL).    Allergies  Allergen Reactions  . Pork-Derived Products Nausea And Vomiting    Antimicrobials this admission: Piperacillin/tazobactam 4/10 >>  Dose adjustments this admission:  Microbiology results: None  Thank you for allowing pharmacy to be a part of this patient's care.  Cindi Carbon, PharmD 03/29/2019 12:38 PM

## 2019-03-30 ENCOUNTER — Inpatient Hospital Stay (HOSPITAL_COMMUNITY): Payer: 59

## 2019-03-30 LAB — BASIC METABOLIC PANEL
Anion gap: 9 (ref 5–15)
BUN: 26 mg/dL — ABNORMAL HIGH (ref 8–23)
CO2: 23 mmol/L (ref 22–32)
Calcium: 8.2 mg/dL — ABNORMAL LOW (ref 8.9–10.3)
Chloride: 102 mmol/L (ref 98–111)
Creatinine, Ser: 0.82 mg/dL (ref 0.61–1.24)
GFR calc Af Amer: >60 mL/min (ref >60)
GFR calc non Af Amer: >60 mL/min (ref >60)
Glucose, Bld: 155 mg/dL — ABNORMAL HIGH (ref 70–99)
Potassium: 3.9 mmol/L (ref 3.5–5.1)
Sodium: 134 mmol/L — ABNORMAL LOW (ref 135–145)

## 2019-03-30 LAB — MAGNESIUM: Magnesium: 2.2 mg/dL (ref 1.7–2.4)

## 2019-03-30 LAB — GLUCOSE, CAPILLARY
Glucose-Capillary: 105 mg/dL — ABNORMAL HIGH (ref 70–99)
Glucose-Capillary: 136 mg/dL — ABNORMAL HIGH (ref 70–99)
Glucose-Capillary: 154 mg/dL — ABNORMAL HIGH (ref 70–99)

## 2019-03-30 LAB — PHOSPHORUS: Phosphorus: 3.1 mg/dL (ref 2.5–4.6)

## 2019-03-30 MED ORDER — IOHEXOL 300 MG/ML  SOLN
30.0000 mL | Freq: Once | INTRAMUSCULAR | Status: AC | PRN
  Administered 2019-03-30: 13:00:00 30 mL via ORAL

## 2019-03-30 MED ORDER — SODIUM CHLORIDE (PF) 0.9 % IJ SOLN
INTRAMUSCULAR | Status: AC
  Filled 2019-03-30: qty 50

## 2019-03-30 MED ORDER — IOHEXOL 300 MG/ML  SOLN
100.0000 mL | Freq: Once | INTRAMUSCULAR | Status: AC | PRN
  Administered 2019-03-30: 13:00:00 100 mL via INTRAVENOUS

## 2019-03-30 MED ORDER — TRAVASOL 10 % IV SOLN
INTRAVENOUS | Status: AC
  Administered 2019-03-30: 17:00:00 via INTRAVENOUS
  Filled 2019-03-30: qty 1188

## 2019-03-30 NOTE — Progress Notes (Signed)
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  "Not as tired".  Assisted with amb an increased distance with increased speed.    Follow Up Recommendations  Home health PT;Supervision for mobility/OOB     Equipment Recommendations  Other (comment)(4WW with seat)    Recommendations for Other Services       Precautions / Restrictions Precautions Precautions: Fall Restrictions Weight Bearing Restrictions: No    Mobility  Bed Mobility Overal bed mobility: Needs Assistance Bed Mobility: Supine to Sit     Supine to sit: Supervision;Min guard     General bed mobility comments: increased time and use of rail   Transfers Overall transfer level: Needs assistance Equipment used: 4-wheeled walker Transfers: Sit to/from Stand Sit to Stand: Supervision;Min guard Stand pivot transfers: Supervision;Min guard       General transfer comment: verbal cues for hand placement and brake use of rollator,with rest breaks  Ambulation/Gait Ambulation/Gait assistance: Supervision;Min guard Gait Distance (Feet): 800 Feet Assistive device: 4-wheeled walker Gait Pattern/deviations: Step-through pattern;Trunk flexed;Decreased stride length Gait velocity: WNL   General Gait Details: required NO seated rest breaks and present with increased gait speed.    Stairs             Wheelchair Mobility    Modified Rankin (Stroke Patients Only)       Balance                                            Cognition Arousal/Alertness: Awake/alert Behavior During Therapy: WFL for tasks  assessed/performed Overall Cognitive Status: Within Functional Limits for tasks assessed                                        Exercises      General Comments        Pertinent Vitals/Pain Pain Assessment: Faces Faces Pain Scale: Hurts a little bit Pain Location: ABD with activity Pain Descriptors / Indicators: Discomfort;Grimacing Pain Intervention(s): Monitored during session;Repositioned    Home Living                      Prior Function            PT Goals (current goals can now be found in the care plan section) Progress towards PT goals: Progressing toward goals    Frequency    Min 3X/week      PT Plan Current plan remains appropriate    Co-evaluation              AM-PAC PT "6 Clicks" Mobility   Outcome Measure  Help needed turning from your back to your side while in a flat bed without using bedrails?: A Little Help needed moving from lying on your back to sitting on the side of a flat bed without using bedrails?: A Little Help needed moving to and from a bed to a chair (including a wheelchair)?:  A Little Help needed standing up from a chair using your arms (e.g., wheelchair or bedside chair)?: A Little Help needed to walk in hospital room?: A Little Help needed climbing 3-5 steps with a railing? : A Little 6 Click Score: 18    End of Session Equipment Utilized During Treatment: Gait belt Activity Tolerance: Patient tolerated treatment well Patient left: in chair;with call bell/phone within reach Nurse Communication: Mobility status PT Visit Diagnosis: Other abnormalities of gait and mobility (R26.89)     Time: 7048-8891 PT Time Calculation (min) (ACUTE ONLY): 24 min  Charges:  $Gait Training: 8-22 mins $Therapeutic Activity: 8-22 mins                     [name]  PTA Acute  Rehabilitation Services Pager      [phone] Office      [phone]

## 2019-03-30 NOTE — Progress Notes (Signed)
[facility] Progress Note  12 Days Post-Op  Subjective: CC-  Getting up to shower. Abdominal pain about the same. States that he was not able to tolerate many clear liquids yesterday. Every time he drinks something other than water it makes his abdominal pain and nausea worse. BM yesterday, minimal flatus.   Going for CT scan today.  Objective: Vital signs in last 24 hours: Temp:  [98.4 F (36.9 C)-98.9 F (37.2 C)] 98.4 F (36.9 C) (04/22 0541) Pulse Rate:  [89-96] 96 (04/22 0541) Resp:  [16-22] 16 (04/22 0541) BP: (118-132)/(70-74) 125/73 (04/22 0541) SpO2:  [97 %-100 %] 97 % (04/22 0541) Last BM Date: 03/29/19  Intake/Output from previous day: 04/21 0701 - 04/22 0700 In: 1070 [P.O.:440; I.V.:480; IV Piggyback:150] Out: 1210 [Urine:1175; Drains:35] Intake/Output this shift: Total I/O In: -  Out: 200 [Urine:200]  PE: Gen:  Alert, NAD HEENT: EOM's intact, pupils equal and round Card:  RRR Pulm:  CTAB, no W/R/R, mild tachypnea with talking Abd: Soft, mild distension, mild tenderness lower abdomen without peritonitis, +BS, drain with serosanguinous output, incisions cdi without erythema or drainage Skin: warm and dry   Lab Results:  Recent Labs    03/28/19 0347 03/29/19 0422  WBC 14.7* 13.9*  HGB 10.2* 10.1*  HCT 31.3* 31.1*  PLT 541* 659*   BMET Recent Labs    03/29/19 0422 03/30/19 0552  NA 132* 134*  K 3.9 3.9  CL 102 102  CO2 22 23  GLUCOSE 139* 155*  BUN 22 26*  CREATININE 0.85 0.82  CALCIUM 7.8* 8.2*   PT/INR No results for input(s): LABPROT, INR in the last 72 hours. CMP     Component Value Date/Time   NA 134 (L) 03/30/2019 0552   K 3.9 03/30/2019 0552   CL 102 03/30/2019 0552   CO2 23 03/30/2019 0552   GLUCOSE 155 (H) 03/30/2019 0552   BUN 26 (H) 03/30/2019 0552   CREATININE 0.82 03/30/2019 0552   CALCIUM 8.2 (L) 03/30/2019 0552   PROT 6.1 (L) 03/29/2019 0422   ALBUMIN 2.1 (L) 03/29/2019 0422   AST 33 03/29/2019 0422    ALT 33 03/29/2019 0422   ALKPHOS 198 (H) 03/29/2019 0422   BILITOT 0.8 03/29/2019 0422   GFRNONAA >60 03/30/2019 0552   GFRAA >60 03/30/2019 0552   Lipase     Component Value Date/Time   LIPASE 24 03/18/2019 1601       Studies/Results: No results found.  Anti-infectives: Anti-infectives (From admission, onward)   Start     Dose/Rate Route Frequency Ordered Stop   03/29/19 1400  piperacillin-tazobactam (ZOSYN) IVPB 3.375 g     3.375 g 12.5 mL/hr over 240 Minutes Intravenous Every 8 hours 03/29/19 1238 04/03/19 1359   03/24/19 1200  piperacillin-tazobactam (ZOSYN) IVPB 3.375 g     3.375 g 12.5 mL/hr over 240 Minutes Intravenous Every 8 hours 03/24/19 1120 03/29/19 1007   03/19/19 0200  piperacillin-tazobactam (ZOSYN) IVPB 3.375 g  Status:  Discontinued     3.375 g 12.5 mL/hr over 240 Minutes Intravenous Every 8 hours 03/19/19 0028 03/19/19 0036   03/19/19 0200  piperacillin-tazobactam (ZOSYN) IVPB 3.375 g     3.375 g 12.5 mL/hr over 240 Minutes Intravenous Every 8 hours 03/19/19 0036 03/24/19 0659   03/18/19 2038  clindamycin (CLEOCIN) 900 mg, gentamicin (GARAMYCIN) 240 mg in sodium chloride 0.9 % 1,000 mL for intraperitoneal lavage  Status:  Discontinued       As needed 03/18/19 2038 03/18/19 2253  03/18/19 1845  ceFAZolin (ANCEF) IVPB 2g/100 mL premix     2 g 200 mL/hr over 30 Minutes Intravenous On call to O.R. 03/18/19 1747 03/18/19 1953   03/18/19 1845  metroNIDAZOLE (FLAGYL) IVPB 500 mg     500 mg 100 mL/hr over 60 Minutes Intravenous On call to O.R. 03/18/19 1747 03/18/19 1953   03/18/19 1830  clindamycin (CLEOCIN) 900 mg, gentamicin (GARAMYCIN) 240 mg in sodium chloride 0.9 % 1,000 mL for intraperitoneal lavage  Status:  Discontinued    Note to Pharmacy:  Have in the  OR room for final irrigation in bowel surgery case to minimize risk of abscess/infection Pharmacy may adjust dosing strength, schedule, rate of infusion, etc as needed to optimize therapy     Irrigation To Surgery 03/18/19 1828 03/19/19 0002   03/18/19 1806  metroNIDAZOLE (FLAGYL) 5-0.79 MG/ML-% IVPB    Note to Pharmacy:  Marney DoctorMentley, Pamela   : cabinet override      03/18/19 1806 03/18/19 1923   03/18/19 1806  ceFAZolin (ANCEF) 2-4 GM/100ML-% IVPB    Note to Pharmacy:  Marney DoctorMentley, Pamela   : cabinet override      03/18/19 1806 03/18/19 1923       Assessment/Plan S/p laparoscopic BIH repairs with absorbable Phasix mesh 4/10 Dr. 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  -cont JP drain for now and follow, will remove prior to d/c -CT scan4/16 reviewed withIR, nothing really to drain -repeating CT scan today  ARF- resolved, Cr0.82 Tachycardia -resolved tachypnea/dyspnea-echo and duplex 4/14 fine, improving, medicine consulted and s/o AMS -resolved Hypokalemia-3.9today, resolved  FEN-PICC, TPN, CLD VTE-Lovenox, SCDs ID-Zosyn 4/11>>day#12 Foley - none Follow up - Dr. Michaell CowingGross  Plan: Repeat CT abdomen/pelvis today.  Continue TPN/clear liquids as tolerated for now. I will call patient's daughter Harrel CarinaMaria Mclean [phone] once CT scan complete.   LOS: 12 days    Franne FortsBrooke A Neven Fina , [facility] 03/30/2019, 8:21 AM Pager: [phone] Mon-Thurs 7:00 am-4:30 pm Fri 7:00 am -11:30 AM Sat-Sun 7:00 am-11:30 am

## 2019-03-30 NOTE — TOC Transition Note (Signed)
Transition of Care [facility]) - CM/SW Discharge Note   Patient Details  Name: Demontrey Davydov MRN: [id number] Date of Birth: [birth year]  [facility] CM/SW Contact:  Clearance Coots, LCSW Phone Number: 03/30/2019, 10:42 AM   Clinical Narrative:    CSW arranged Interim Home Health-Physical therapy for the patient at discharge.  CSW will continue to follow patient progress.    Final next level of care: Skilled Nursing Facility     Patient Goals and CMS Choice Patient states their goals for this hospitalization and ongoing recovery are:: "return home" CMS Medicare.gov Compare Post Acute Care list provided to:: Patient Choice offered to / list presented to : Patient  Discharge Placement                      Discharge Plan and Services In-house Referral: Clinical Social Work Discharge Planning Services: CM Consult Post Acute Care Choice: Home Health          DME Arranged: (Rollator )   HH Arranged: PT     Social Determinants of Health (SDOH) Interventions     Readmission Risk Interventions No flowsheet data found.

## 2019-03-30 NOTE — Progress Notes (Signed)
Went to review CT scan with patient and he was power walking around the unit with therapy because he wanted to get his heart rate up.  Reviewed scan with Dr. Sheliah Hatch, CT overall stable. Several persistent small fluid collections stable in size or smaller than on previous scan. No drainable fluid collection, no evidence of bowel obstruction.  Will let him trial full liquids. Continue IV zosyn for now and follow clinically. Repeat CBC in AM. I also called and discussed CT with patient's daughter Byrd Hesselbach [phone].  Franne Forts, [facility] 03/30/2019, 3:10 PM Pager: [phone] Mon 7:00 am -11:30 AM Tues-Fri 7:00 am-4:30 pm Sat-Sun 7:00 am-11:30 am

## 2019-03-30 NOTE — Progress Notes (Signed)
PHARMACY - ADULT TOTAL PARENTERAL NUTRITION CONSULT NOTE   Pharmacy Consult for TPN Indication: Prolonged ileus  Patient Measurements: Height: 5\' 9"  (175.3 cm) Weight: 160 lb (72.6 kg) IBW/kg (Calculated) : 70.7 TPN AdjBW (KG): 72.6 Body mass index is 23.63 kg/m.  Current Nutrition: Clear liquid diet  IVF: D5 1/2 NS with 20 KCl at 10 mL/hr  Central access: Double lumen PICC placed 4/17 TPN start date: 4/17  ASSESSMENT                                                                                        HPI: 68 yo male presents 03/18/19 with nausea & vomiting. He has a history of left inguinal hernia repair in 1991. He was taken for emergent to surgery for incarcerated leftinguinal hernia perforation of the jejunumcausingSBO, underwent laparoscopiclysis of adhesions, small bowel resection, lap bil inguinal and right femoral hernia repairs with absorbable mesh on 03/18/2019.  Significant events:  -4/10: Surgery -4/17: TPN started -4/21: Diet advanced to clear liquid  Insulin Requirements: n/a  Today, 03/30/19  Glucose: No hx DM, CBGs at goal < 150. No SSI.  Electrolytes: WNL except Na 134 remains slightly low despite increased Na amount in TPN, but is trending up compared to previous level. CorrCa is 9.7, WNL  Renal: SCr WNL and stable  LFTs: AST/ALT WNL, AlkPhos elevated but stable  TGs: 496 (4/17) 236 (4/20) - note hx HLD - TGs much improved  Prealbumin: 7.4 (4/17), 11.7 (4/20) - improving  NUTRITIONAL GOALS                                                                           RD recs (4/21): Kcal:  6160-7371 kcal Protein:  100-115 grams Fluid:  >/= 2 L/day  Custom TPN at goal rate of 90 mL/hr provides: -  119 g/day protein ( 55  g/L) -  497 g/day Dextrose ( 23 %) -  0 g/day Lipid  ( 0  G/L) - hold due to hypertriglyceridemia -  2164 Kcal/day  PLAN                                                                                                       At  1800 today:  Continue custom TPN at goal rate of 90 mL/hr -Note that TG's have greatly improved but will continue TPN without lipids for now given such drastic change in TG in only 3 days and will recheck TGs  in a few days to confirm that they are still at current level/improving still and can likely add lipids back to TPN at that time  Electrolytes in TPN: no changes. Continue standard concentration except increased Na and K, Cl:Ac ratio 1:2  TPN to contain standard MVI but to give trace elements only on MWF due to current national shortage  Continue IVF at 8410ml/hr per MD  Continue CBG checks q8h - add insulin coverage if needed.   TPN lab panels on Mondays & Thursdays.  F/u daily.   Adalberto ColeNikola Mala Gibbard, PharmD, BCPS Pager [phone] 03/30/2019 9:08 AM

## 2019-03-31 LAB — COMPREHENSIVE METABOLIC PANEL
ALT: 33 U/L (ref 0–44)
AST: 25 U/L (ref 15–41)
Albumin: 2.3 g/dL — ABNORMAL LOW (ref 3.5–5.0)
Alkaline Phosphatase: 216 U/L — ABNORMAL HIGH (ref 38–126)
Anion gap: 8 (ref 5–15)
BUN: 24 mg/dL — ABNORMAL HIGH (ref 8–23)
CO2: 25 mmol/L (ref 22–32)
Calcium: 8.2 mg/dL — ABNORMAL LOW (ref 8.9–10.3)
Chloride: 101 mmol/L (ref 98–111)
Creatinine, Ser: 0.89 mg/dL (ref 0.61–1.24)
GFR calc Af Amer: >60 mL/min (ref >60)
GFR calc non Af Amer: >60 mL/min (ref >60)
Glucose, Bld: 148 mg/dL — ABNORMAL HIGH (ref 70–99)
Potassium: 3.7 mmol/L (ref 3.5–5.1)
Sodium: 134 mmol/L — ABNORMAL LOW (ref 135–145)
Total Bilirubin: 0.8 mg/dL (ref 0.3–1.2)
Total Protein: 6.7 g/dL (ref 6.5–8.1)

## 2019-03-31 LAB — PHOSPHORUS: Phosphorus: 3.1 mg/dL (ref 2.5–4.6)

## 2019-03-31 LAB — CBC
HCT: 30.9 % — ABNORMAL LOW (ref 39.0–52.0)
Hemoglobin: 9.8 g/dL — ABNORMAL LOW (ref 13.0–17.0)
MCH: 29.9 pg (ref 26.0–34.0)
MCHC: 31.7 g/dL (ref 30.0–36.0)
MCV: 94.2 fL (ref 80.0–100.0)
Platelets: 690 10*3/uL — ABNORMAL HIGH (ref 150–400)
RBC: 3.28 MIL/uL — ABNORMAL LOW (ref 4.22–5.81)
RDW: 12.5 % (ref 11.5–15.5)
WBC: 9.4 10*3/uL (ref 4.0–10.5)
nRBC: 0 % (ref 0.0–0.2)

## 2019-03-31 LAB — GLUCOSE, CAPILLARY
Glucose-Capillary: 129 mg/dL — ABNORMAL HIGH (ref 70–99)
Glucose-Capillary: 157 mg/dL — ABNORMAL HIGH (ref 70–99)

## 2019-03-31 LAB — MAGNESIUM: Magnesium: 2.2 mg/dL (ref 1.7–2.4)

## 2019-03-31 LAB — TRIGLYCERIDES: Triglycerides: 227 mg/dL — ABNORMAL HIGH (ref <150)

## 2019-03-31 MED ORDER — TRAVASOL 10 % IV SOLN
INTRAVENOUS | Status: AC
  Administered 2019-03-31: 18:00:00 via INTRAVENOUS
  Filled 2019-03-31: qty 561.6

## 2019-03-31 MED ORDER — PIPERACILLIN-TAZOBACTAM 3.375 G IVPB
3.3750 g | Freq: Three times a day (TID) | INTRAVENOUS | Status: AC
  Administered 2019-03-31 – 2019-04-01 (×5): 3.375 g via INTRAVENOUS
  Filled 2019-03-31 (×5): qty 50

## 2019-03-31 MED ORDER — SACCHAROMYCES BOULARDII 250 MG PO CAPS
250.0000 mg | ORAL_CAPSULE | Freq: Two times a day (BID) | ORAL | Status: DC
  Administered 2019-03-31 – 2019-04-04 (×9): 250 mg via ORAL
  Filled 2019-03-31 (×9): qty 1

## 2019-03-31 MED ORDER — POTASSIUM CHLORIDE 10 MEQ/50ML IV SOLN
10.0000 meq | INTRAVENOUS | Status: AC
  Administered 2019-03-31 (×2): 10 meq via INTRAVENOUS
  Filled 2019-03-31 (×2): qty 50

## 2019-03-31 NOTE — Progress Notes (Signed)
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  Follow Up Recommendations  Home health PT;Supervision for mobility/OOB     Equipment Recommendations  (4ww WITH SEAT)    Recommendations for Other Services       Precautions / Restrictions Precautions Precautions: Fall Restrictions Weight Bearing Restrictions: No    Mobility  Bed Mobility Overal bed mobility: Modified Independent             General bed mobility comments: self able with increased time back to bed  Transfers Overall transfer level: Needs assistance Equipment used: 4-wheeled walker Transfers: Sit to/from Stand Sit to Stand: Supervision;Min guard Stand pivot transfers: Supervision;Min guard       General transfer comment: verbal cues for hand placement and brake use of rollator,with rest breaks  Ambulation/Gait Ambulation/Gait assistance: Supervision;Min guard Gait Distance (Feet): 800 Feet Assistive device: 4-wheeled walker Gait Pattern/deviations: Step-through pattern;Trunk flexed;Decreased stride length Gait velocity: WNL   General Gait Details: required NO seated rest breaks at a Mod rate   Stairs             Wheelchair Mobility    Modified Rankin (Stroke Patients Only)       Balance                                            Cognition Arousal/Alertness: Awake/alert Behavior During Therapy: WFL for tasks assessed/performed Overall Cognitive Status: Within Functional Limits for tasks assessed                                 General Comments: required increased time       Exercises      General Comments        Pertinent Vitals/Pain Pain Assessment: Faces Faces Pain Scale: Hurts a little bit Pain Location: ABD with activity but "getting better" Pain Descriptors / Indicators: Discomfort;Grimacing    Home Living                      Prior Function            PT Goals (current goals can now be found in the care plan section) Progress towards PT goals: Progressing toward goals    Frequency    Min 3X/week      PT Plan Current plan remains appropriate    Co-evaluation              AM-PAC PT "6 Clicks" Mobility   Outcome Measure  Help needed turning from your back to your side while in a flat bed without using bedrails?: None Help needed moving from lying on your back to sitting on the side of a flat bed without using bedrails?: None Help needed moving to and from a bed to a chair (including a wheelchair)?: A Little Help needed standing up from a chair using your arms (e.g., wheelchair or bedside chair)?: A Little Help needed to walk in hospital room?: A  Little Help needed climbing 3-5 steps with a railing? : A Little 6 Click Score: 20    End of Session Equipment Utilized During Treatment: Gait belt Activity Tolerance: Patient tolerated treatment well Patient left: in chair;in bed Nurse Communication: Mobility status PT Visit Diagnosis: Other abnormalities of gait and mobility (R26.89)     Time: [id number] PT Time Calculation (min) (ACUTE ONLY): 40 min  Charges:  $Gait Training: 23-37 mins $Therapeutic Activity: 8-22 mins                     Felecia ShellingLori Pedrohenrique [place]  PTA Acute  Rehabilitation Services Pager      [phone] Office      [phone]

## 2019-03-31 NOTE — Progress Notes (Signed)
PHARMACY - ADULT TOTAL PARENTERAL NUTRITION CONSULT NOTE   Pharmacy Consult for TPN Indication: Prolonged ileus  Patient Measurements: Height: 5\' 9"  (175.3 cm) Weight: 160 lb (72.6 kg) IBW/kg (Calculated) : 70.7 TPN AdjBW (KG): 72.6 Body mass index is 23.63 kg/m.  Current Nutrition: Full liquid diet  IVF: D5 1/2 NS with 20 KCl at 10 mL/hr  Central access: Double lumen PICC placed 4/17 TPN start date: 4/17  ASSESSMENT                                                                                        HPI: 68 yo male presents 03/18/19 with nausea & vomiting. He has a history of left inguinal hernia repair in 1991. He was taken for emergent to surgery for incarcerated leftinguinal hernia perforation of the jejunumcausingSBO, underwent laparoscopiclysis of adhesions, small bowel resection, lap bil inguinal and right femoral hernia repairs with absorbable mesh on 03/18/2019.  Significant events:  -4/10: Surgery -4/17: TPN started -4/21: Diet advanced to clear liquid -4/22: Diet advanced to full liquid  Insulin Requirements: n/a  Today, 03/31/19  Glucose: No hx DM, CBGs at goal < 150. No SSI ordered.  Electrolytes: WNL except Na slightly low but stable. K 3.7 is WNL but on lower end of normal and slightly decreased. Corrected Ca (9.6) WNL.  Renal: SCr WNL and stable  LFTs: AST/ALT WNL, AlkPhos remains elevated, slightly increased  TGs: 496 (4/17) 236 (4/20), 227 (4/23) - note hx HLD - TGs much improved and stable  Prealbumin: 7.4 (4/17), 11.7 (4/20) - improving  NUTRITIONAL GOALS                                                                           RD recs (4/21): Kcal:  [id number] kcal Protein:  100-115 grams Fluid:  >/= 2 L/day  Custom TPN at goal rate of 90 mL/hr provides: -  112 g/day protein -  432 g/day Dextrose (20 %) -  43 g/day Lipid -  2350 Kcal/day  Pharmacy received consult from surgery today to decrease TPN to half-rate starting on 4/23  PLAN                                                                                                         Now:  KCl 10 mEq IV x 2 runs  At 1800 today:  Reduce rate of custom TPN to 45 mL/hr (1/2 of goal rate) per surgery. Hopeful  this will help to stimulate appetite.   TG remain slightly elevated but stable. Since <400, will add lipids to TPN today  Electrolytes in TPN: no changes. Continue standard concentration except increased Na and K, Cl:Ac ratio 1:2  TPN to contain standard MVI but to give trace elements only on MWF due to current national shortage  Continue IVF at 23ml/hr per MD  Continue CBG checks q8h - add insulin coverage if needed.   TPN lab panels on Mondays & Thursdays.  Recheck electrolytes tomorrow given advancing diet and decreased TPN rate  F/u daily.  Cindi Carbon, PharmD 03/31/19 10:18 AM

## 2019-03-31 NOTE — Discharge Instructions (Addendum)
[facility] _______[facility] ° °HERNIA REPAIR: POST OP INSTRUCTIONS ° °Always review your discharge instruction sheet given to you by the facility where your surgery was performed. °IF YOU HAVE DISABILITY OR FAMILY LEAVE FORMS, YOU MUST BRING THEM TO THE OFFICE FOR PROCESSING.   °DO NOT GIVE THEM TO YOUR DOCTOR. ° °1. A  prescription for pain medication may be given to you upon discharge.  Take your pain medication as prescribed, if needed.  If narcotic pain medicine is not needed, then you may take acetaminophen (Tylenol) or ibuprofen (Advil) as needed. °2. Take your usually prescribed medications unless otherwise directed. °If you need a refill on your pain medication, please contact your pharmacy.  They will contact our office to request authorization. Prescriptions will not be filled after 5 pm or on week-ends. °3. You should follow a light diet the first 24 hours after arrival home, such as soup and crackers, etc.  Be sure to include lots of fluids daily.  Resume your normal diet the day after surgery. °4.Most patients will experience some swelling and bruising around the umbilicus or in the groin and scrotum.  Ice packs and reclining will help.  Swelling and bruising can take several days to resolve.  °6. It is common to experience some constipation if taking pain medication after surgery.  Increasing fluid intake and taking a stool softener (such as Colace) will usually help or prevent this problem from occurring.  A mild laxative (Milk of Magnesia or Miralax) should be taken according to package directions if there are no bowel movements after 48 hours. °7. Unless discharge instructions indicate otherwise, you may remove your bandages 24-48 hours after surgery, and you may shower at that time.  You may have steri-strips (small skin tapes) in place directly over the incision.  These strips should be left on the skin for 7-10 days.  If your surgeon used skin glue on the incision, you may shower in  24 hours.  The glue will flake off over the next 2-3 weeks.  Any sutures or staples will be removed at the office during your follow-up visit. °8. ACTIVITIES:  You may resume regular (light) daily activities beginning the next day--such as daily self-care, walking, climbing stairs--gradually increasing activities as tolerated.  You may have sexual intercourse when it is comfortable.  Refrain from any heavy lifting or straining until approved by your doctor. ° °a.You may drive when you are no longer taking prescription pain medication, you can comfortably wear a seatbelt, and you can safely maneuver your car and apply brakes. °b.RETURN TO WORK:   °_____________________________________________ ° °9.You should see your doctor in the office for a follow-up appointment approximately 2-3 weeks after your surgery.  Make sure that you call for this appointment within a day or two after you arrive home to insure a convenient appointment time. °10.OTHER INSTRUCTIONS: _________________________ °   _____________________________________ ° °WHEN TO CALL YOUR DOCTOR: °1. Fever over 101.0 °2. Inability to urinate °3. Nausea and/or vomiting °4. Extreme swelling or bruising °5. Continued bleeding from incision. °6. Increased pain, redness, or drainage from the incision ° °The clinic staff is available to answer your questions during regular business hours.  Please don’t hesitate to call and ask to speak to one of the nurses for clinical concerns.  If you have a medical emergency, go to the nearest emergency room or call 911.  A surgeon from [facility] is always on call at the hospital ° ° °[address], [address], [place], [place]  [address] ? ° [address] [address], [place], [place]   [address] °[phone] ? [phone] ? FAX [phone] °Web site: [email or web address] ° °

## 2019-03-31 NOTE — Progress Notes (Signed)
[facility] Progress Note  13 Days Post-Op  Subjective: CC-  No new complaints. Tolerating few sips of clear liquids. Did not try any full liquids yesterday. States that he does have an appetite but he is afraid to eat too much. He had BM x2 yesterday. Passing some flatus. Still having some hiccups but states that they are less.   Objective: Vital signs in last 24 hours: Temp:  [98.1 F (36.7 C)-99 F (37.2 C)] 98.1 F (36.7 C) (04/23 0506) Pulse Rate:  [85-97] 85 (04/23 0506) Resp:  [16-18] 18 (04/23 0506) BP: (119-130)/(67-71) 130/71 (04/23 0506) SpO2:  [97 %-99 %] 98 % (04/23 0506) Last BM Date: 03/30/19  Intake/Output from previous day: 04/22 0701 - 04/23 0700 In: 410 [P.O.:300; I.V.:10; IV Piggyback:100] Out: 2070 [Urine:2025; Drains:45] Intake/Output this shift: No intake/output data recorded.  PE: Gen: Alert, NAD HEENT: EOM's intact, pupils equal and round Card: RRR Pulm: CTAB, no W/R/R, effort normal Abd: Soft,mild distension, nontender, +BS,drain with trace serosanguinous output, incisions cdi without erythema or drainage Skin: warm and dry    Lab Results:  Recent Labs    03/29/19 0422 03/31/19 0502  WBC 13.9* 9.4  HGB 10.1* 9.8*  HCT 31.1* 30.9*  PLT 659* 690*   BMET Recent Labs    03/30/19 0552 03/31/19 0502  NA 134* 134*  K 3.9 3.7  CL 102 101  CO2 23 25  GLUCOSE 155* 148*  BUN 26* 24*  CREATININE 0.82 0.89  CALCIUM 8.2* 8.2*   PT/INR No results for input(s): LABPROT, INR in the last 72 hours. CMP     Component Value Date/Time   NA 134 (L) 03/31/2019 0502   K 3.7 03/31/2019 0502   CL 101 03/31/2019 0502   CO2 25 03/31/2019 0502   GLUCOSE 148 (H) 03/31/2019 0502   BUN 24 (H) 03/31/2019 0502   CREATININE 0.89 03/31/2019 0502   CALCIUM 8.2 (L) 03/31/2019 0502   PROT 6.7 03/31/2019 0502   ALBUMIN 2.3 (L) 03/31/2019 0502   AST 25 03/31/2019 0502   ALT 33 03/31/2019 0502   ALKPHOS 216 (H) 03/31/2019 0502   BILITOT  0.8 03/31/2019 0502   GFRNONAA >60 03/31/2019 0502   GFRAA >60 03/31/2019 0502   Lipase     Component Value Date/Time   LIPASE 24 03/18/2019 1601       Studies/Results: Ct Abdomen Pelvis W Contrast  Result Date: 03/30/2019 CLINICAL DATA:  Abdominal infection. EXAM: CT ABDOMEN AND PELVIS WITH CONTRAST TECHNIQUE: Multidetector CT imaging of the abdomen and pelvis was performed using the standard protocol following bolus administration of intravenous contrast. CONTRAST:  100mL OMNIPAQUE IOHEXOL 300 MG/ML SOLN, 30mL OMNIPAQUE IOHEXOL 300 MG/ML SOLN COMPARISON:  CT scan of March 24, 2019. FINDINGS: Lower chest: Mild bilateral pleural effusions are noted with adjacent subsegmental atelectasis. Hepatobiliary: No focal liver abnormality is seen. No gallstones, gallbladder wall thickening, or biliary dilatation. Pancreas: Unremarkable. No pancreatic ductal dilatation or surrounding inflammatory changes. Spleen: Stable calcified splenic granulomata are noted. Adrenals/Urinary Tract: Adrenal glands are unremarkable. Kidneys are normal, without renal calculi, focal lesion, or hydronephrosis. Bladder is unremarkable. Stomach/Bowel: The stomach is unremarkable. The appendix appears normal. No definite evidence of bowel obstruction is noted. Postsurgical changes are seen involving the small bowel in the left lower quadrant of the abdomen. Mild wall and fold thickening is seen involving the small bowel and the left lower quadrant consistent with focal inflammation. Vascular/Lymphatic: Aortic atherosclerosis. No enlarged abdominal or pelvic lymph nodes. Reproductive: There are  again noted 2 moderate size inguinal hernias, left greater than right, both of which are filled with fluid. Stable position of surgical drain is seen anteriorly in the pelvis. Other: Small amount of free fluid is noted in the dependent portion of the pelvis. 23 x 12 mm fluid collection is noted in the left lower quadrant best seen on image  number 57 of series 2 which is significantly smaller compared to prior exam. Ill-defined fluid collection measuring 24 x 19 mm is noted in the left side of the abdomen best seen on image number 51 of series 2 which is not significantly changed compared to prior exam. Grossly stable fluid collection measuring 54 x 19 mm is noted anteriorly in the left lower quadrant best seen on image number 66 of series 2. Musculoskeletal: No acute or significant osseous findings. IMPRESSION: Status post small bowel resection. Continued presence of inflammatory wall and fold thickening is seen involving small bowel loops in the pelvis. Several small fluid collections are noted in the left side of the abdomen, most of which are stable in size compared to prior exam, although at least 1 is significantly smaller compared to prior exam. Continued presence of 2 moderate size inguinal hernias, left greater than right, both of which are filled with fluid. Surgical drain is unchanged in position. Mild bilateral pleural effusions are noted with adjacent subsegmental atelectasis. Aortic Atherosclerosis (ICD10-I70.0). Electronically Signed   By: Lupita Raider M.D.   On: 03/30/2019 14:11    Anti-infectives: Anti-infectives (From admission, onward)   Start     Dose/Rate Route Frequency Ordered Stop   03/29/19 1400  piperacillin-tazobactam (ZOSYN) IVPB 3.375 g     3.375 g 12.5 mL/hr over 240 Minutes Intravenous Every 8 hours 03/29/19 1238 04/03/19 1359   03/24/19 1200  piperacillin-tazobactam (ZOSYN) IVPB 3.375 g     3.375 g 12.5 mL/hr over 240 Minutes Intravenous Every 8 hours 03/24/19 1120 03/29/19 1007   03/19/19 0200  piperacillin-tazobactam (ZOSYN) IVPB 3.375 g  Status:  Discontinued     3.375 g 12.5 mL/hr over 240 Minutes Intravenous Every 8 hours 03/19/19 0028 03/19/19 0036   03/19/19 0200  piperacillin-tazobactam (ZOSYN) IVPB 3.375 g     3.375 g 12.5 mL/hr over 240 Minutes Intravenous Every 8 hours 03/19/19 0036  03/24/19 0659   03/18/19 2038  clindamycin (CLEOCIN) 900 mg, gentamicin (GARAMYCIN) 240 mg in sodium chloride 0.9 % 1,000 mL for intraperitoneal lavage  Status:  Discontinued       As needed 03/18/19 2038 03/18/19 2253   03/18/19 1845  ceFAZolin (ANCEF) IVPB 2g/100 mL premix     2 g 200 mL/hr over 30 Minutes Intravenous On call to O.R. 03/18/19 1747 03/18/19 1953   03/18/19 1845  metroNIDAZOLE (FLAGYL) IVPB 500 mg     500 mg 100 mL/hr over 60 Minutes Intravenous On call to O.R. 03/18/19 1747 03/18/19 1953   03/18/19 1830  clindamycin (CLEOCIN) 900 mg, gentamicin (GARAMYCIN) 240 mg in sodium chloride 0.9 % 1,000 mL for intraperitoneal lavage  Status:  Discontinued    Note to Pharmacy:  Have in the  OR room for final irrigation in bowel surgery case to minimize risk of abscess/infection Pharmacy may adjust dosing strength, schedule, rate of infusion, etc as needed to optimize therapy    Irrigation To Surgery 03/18/19 1828 03/19/19 0002   03/18/19 1806  metroNIDAZOLE (FLAGYL) 5-0.79 MG/ML-% IVPB    Note to Pharmacy:  [name]   : cabinet override  03/18/19 1806 03/18/19 1923   03/18/19 1806  ceFAZolin (ANCEF) 2-4 GM/100ML-% IVPB    Note to Pharmacy:  [name]   : cabinet override      03/18/19 1806 03/18/19 1923       Assessment/Plan S/p laparoscopic BIH repairs with absorbable Phasix mesh4/10 Dr. 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  -cont JP drain for now and follow, will remove prior to d/c -CTscan4/16reviewed withIR, nothing really to drain -repeat CT scan 4/22 with several persistent small fluid collections stable in size or smaller than on previous scan, no drainable fluid collection, no evidence of bowel obstruction  ARF- resolved, Cr0.89 Tachycardia -resolved tachypnea/dyspnea-echo and duplex 4/89fine, improving, medicine  consulted and s/o AMS -resolved Hypokalemia-3.7today, resolved  FEN- 1/2 TPN, FLD VTE-Lovenox, SCDs ID-Zosyn 4/11>>day#13/14. WBC WNL today, afebrile Foley - none Follow up - Dr. Leanne Chang - daughter Byrd Hesselbach [phone]  Plan: Decrease TPN to 1/2 rate and continue full liquids. Encourage PO intake. May need calorie count but will see how he does today. Add probiotic. Plan to d/c abx tomorrow.   LOS: 13 days    Franne Forts , [facility] 03/31/2019, 8:12 AM Pager: [phone] Mon-Thurs 7:00 am-4:30 pm Fri 7:00 am -11:30 AM Sat-Sun 7:00 am-11:30 am

## 2019-03-31 NOTE — Plan of Care (Signed)
  Problem: Clinical Measurements: Goal: Ability to maintain clinical measurements within normal limits will improve Outcome: Progressing Goal: Postoperative complications will be avoided or minimized Outcome: Progressing   Problem: Health Behavior/Discharge Planning: Goal: Ability to manage health-related needs will improve Outcome: Progressing   Problem: Activity: Goal: Risk for activity intolerance will decrease Outcome: Progressing   Problem: Coping: Goal: Level of anxiety will decrease Outcome: Progressing   Problem: Elimination: Goal: Will not experience complications related to bowel motility Outcome: Progressing   Problem: Pain Managment: Goal: General experience of comfort will improve Outcome: Progressing

## 2019-03-31 NOTE — TOC Progression Note (Signed)
Transition of Care [facility]) - Progression Note    Patient Details  Name: Omar Bell MRN: [id number] Date of Birth: Jul 20, 1951  Transition of Care [facility]) CM/SW Contact  Clearance Coots, LCSW Phone Number: 03/31/2019, 3:00 PM  Clinical Narrative:    Patient has decided to go to a [facility] at discharge. [address], [place], [address] CSW updated Home Health agency to [facility].(Interim home health services in [place], [place] cannot cross the [employer].CSW arranged [facility]).  Expected Discharge Plan: Skilled Nursing Facility    Expected Discharge Plan and Services Expected Discharge Plan: Skilled Nursing Facility In-house Referral: Clinical Social Work Discharge Planning Services: CM Consult Post Acute Care Choice: Home Health Living arrangements for the past 2 months: Apartment                 DME Arranged: Aeronautical engineer ) DME Agency: [employer] Date DME Agency Contacted: 03/31/19 Time DME Agency Contacted: [phone] Representative spoke with at DME Agency: [facility] HH Arranged: PT, RN HH Agency: [facility] Date HH Agency Contacted: 03/31/19 Time HH Agency Contacted: 1418 Representative spoke with at [facility] Agency: Clydie Braun    Social Determinants of Health (SDOH) Interventions    Readmission Risk Interventions No flowsheet data found.

## 2019-04-01 LAB — GLUCOSE, CAPILLARY
Glucose-Capillary: 101 mg/dL — ABNORMAL HIGH (ref 70–99)
Glucose-Capillary: 115 mg/dL — ABNORMAL HIGH (ref 70–99)
Glucose-Capillary: 117 mg/dL — ABNORMAL HIGH (ref 70–99)
Glucose-Capillary: 120 mg/dL — ABNORMAL HIGH (ref 70–99)

## 2019-04-01 LAB — BASIC METABOLIC PANEL
Anion gap: 7 (ref 5–15)
BUN: 27 mg/dL — ABNORMAL HIGH (ref 8–23)
CO2: 25 mmol/L (ref 22–32)
Calcium: 8.2 mg/dL — ABNORMAL LOW (ref 8.9–10.3)
Chloride: 102 mmol/L (ref 98–111)
Creatinine, Ser: 0.92 mg/dL (ref 0.61–1.24)
GFR calc Af Amer: >60 mL/min (ref >60)
GFR calc non Af Amer: >60 mL/min (ref >60)
Glucose, Bld: 118 mg/dL — ABNORMAL HIGH (ref 70–99)
Potassium: 3.9 mmol/L (ref 3.5–5.1)
Sodium: 134 mmol/L — ABNORMAL LOW (ref 135–145)

## 2019-04-01 LAB — MAGNESIUM: Magnesium: 2.3 mg/dL (ref 1.7–2.4)

## 2019-04-01 LAB — PHOSPHORUS: Phosphorus: 3.6 mg/dL (ref 2.5–4.6)

## 2019-04-01 MED ORDER — POLYETHYLENE GLYCOL 3350 17 G PO PACK
17.0000 g | PACK | Freq: Two times a day (BID) | ORAL | Status: DC
  Filled 2019-04-01 (×6): qty 1

## 2019-04-01 MED ORDER — PRO-STAT SUGAR FREE PO LIQD
30.0000 mL | Freq: Three times a day (TID) | ORAL | Status: DC
  Administered 2019-04-02 – 2019-04-04 (×6): 30 mL via ORAL
  Filled 2019-04-01 (×8): qty 30

## 2019-04-01 MED ORDER — BOOST / RESOURCE BREEZE PO LIQD CUSTOM
1.0000 | Freq: Three times a day (TID) | ORAL | Status: DC
  Administered 2019-04-01 – 2019-04-04 (×4): 1 via ORAL

## 2019-04-01 MED ORDER — TRAVASOL 10 % IV SOLN
INTRAVENOUS | Status: AC
  Administered 2019-04-01: 18:00:00 via INTRAVENOUS
  Filled 2019-04-01: qty 561.6

## 2019-04-01 MED ORDER — BISACODYL 10 MG RE SUPP: 10.0000 mg | Freq: Two times a day (BID) | RECTAL | Status: DC | PRN

## 2019-04-01 MED ORDER — POLYETHYLENE GLYCOL 3350 17 G PO PACK: 17.0000 g | PACK | Freq: Two times a day (BID) | ORAL | Status: DC | PRN

## 2019-04-01 NOTE — Progress Notes (Signed)
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    Follow Up Recommendations  Home health PT;Supervision for mobility/OOB     Equipment Recommendations  (4WW delivered to room (Thank You))    Recommendations for Other Services       Precautions / Restrictions Precautions Precautions: Fall Precaution Comments: JP Drain Restrictions Weight Bearing Restrictions: No    Mobility  Bed Mobility Overal bed mobility: Modified Independent             General bed mobility comments: self able with increased time back to bed  Transfers Overall transfer level: Needs assistance Equipment used: 4-wheeled walker Transfers: Sit to/from Stand Sit to Stand: Supervision;Min guard Stand pivot transfers: Supervision;Min guard       General transfer comment: verbal cues for hand placement and brake use of rollator,with NO rest breaks  Ambulation/Gait Ambulation/Gait assistance: Supervision;Min guard Gait Distance (Feet): 650 Feet Assistive device: 4-wheeled walker Gait Pattern/deviations: Step-through pattern;Trunk flexed;Decreased stride length Gait velocity: WNL   General Gait Details: required NO seated rest breaks at a Mod rate   Stairs             Wheelchair Mobility    Modified Rankin (Stroke Patients Only)       Balance                                            Cognition Arousal/Alertness: Awake/alert Behavior During Therapy: WFL for tasks assessed/performed Overall Cognitive Status: Within Functional Limits for tasks assessed                                 General Comments: required increased time      Exercises      General Comments        Pertinent Vitals/Pain Pain Assessment: Faces Faces Pain Scale: Hurts a little bit Pain Location: ABD with activity but "getting better" Pain Descriptors / Indicators: Discomfort;Grimacing Pain Intervention(s): Monitored during session    Home Living                      Prior Function            PT Goals (current goals can now be found in the care plan section) Progress towards PT goals: Progressing toward goals    Frequency    Min 3X/week      PT Plan Current plan remains appropriate    Co-evaluation              AM-PAC PT "6 Clicks" Mobility   Outcome Measure  Help needed turning from your back to your side while in a flat bed without using bedrails?: None Help needed moving from lying on your back to sitting on the side of a flat bed without using bedrails?: None Help needed moving to and from a bed to a chair (including a wheelchair)?: A Little Help needed standing up from a chair using your arms (e.g.,  wheelchair or bedside chair)?: A Little Help needed to walk in hospital room?: A Little Help needed climbing 3-5 steps with a railing? : A Little 6 Click Score: 20    End of Session Equipment Utilized During Treatment: Gait belt Activity Tolerance: Patient tolerated treatment well Patient left: in chair;with call bell/phone within reach Nurse Communication: Mobility status PT Visit Diagnosis: Other abnormalities of gait and mobility (R26.89)     Time: [id number] PT Time Calculation (min) (ACUTE ONLY): 28 min  Charges:  $Gait Training: 8-22 mins $Therapeutic Activity: 8-22 mins                     Felecia ShellingLori Kyndal Gloster  PTA Acute  Rehabilitation Services Pager      [phone] Office      [phone]

## 2019-04-01 NOTE — Progress Notes (Signed)
PHARMACY - ADULT TOTAL PARENTERAL NUTRITION CONSULT NOTE   Pharmacy Consult for TPN Indication: Prolonged ileus  Patient Measurements: Height: 5\' 9"  (175.3 cm) Weight: 160 lb (72.6 kg) IBW/kg (Calculated) : 70.7 TPN AdjBW (KG): 72.6 Body mass index is 23.63 kg/m.  Current Nutrition: Soft diet  IVF: D5 1/2 NS with 20 KCl at 10 mL/hr  Central access: Double lumen PICC placed 4/17 TPN start date: 4/17  ASSESSMENT                                                                                        HPI: 68 yo male presents 03/18/19 with nausea & vomiting. He has a history of left inguinal hernia repair in 1991. He was taken for emergent to surgery for incarcerated leftinguinal hernia perforation of the jejunumcausingSBO, underwent laparoscopiclysis of adhesions, small bowel resection, lap bil inguinal and right femoral hernia repairs with absorbable mesh on 03/18/2019.  Significant events:  -4/10: Surgery -4/17: TPN started -4/21: Diet advanced to clear liquid -4/22: Diet advanced to full liquid -4/23: TPN reduced to 1/2 of goal rate. Lipids added to TPN. -4/24: Diet advanced to soft  Insulin Requirements: n/a  Today, 04/01/19  Glucose: No hx DM, CBGs in goal rage of 100-150. No SSI ordered.  Electrolytes: WNL except Na slightly low but stable. Corrected Ca (9.6) WNL.  Renal: SCr WNL and stable  LFTs: AST/ALT WNL, AlkPhos remains elevated, slightly increased  TGs: 496 (4/17) 236 (4/20), 227 (4/23) - note hx HLD - TGs much improved and stable  Prealbumin: 7.4 (4/17), 11.7 (4/20) - improving  NUTRITIONAL GOALS                                                                           RD recs (4/21): Kcal:  5009-3818 kcal Protein:  100-115 grams Fluid:  >/= 2 L/day  Custom TPN at goal rate of 90 mL/hr provides: -  112 g/day protein -  432 g/day Dextrose (20 %) -  43 g/day Lipid -  2350 Kcal/day  Pharmacy received consult from surgery to decrease TPN to half of  goal rate starting on 4/23  PLAN                                                                                                        At 1800 today:  Continue custom TPN at 45 mL/hr (1/2 of goal rate). Hopeful this will help to stimulate appetite.  Electrolytes in TPN: no changes. Continue standard concentration except increased Na and K, Cl:Ac ratio 1:2  TPN to contain standard MVI but to give trace elements only on MWF due to current national shortage  Continue IVF at 2410ml/hr per MD  Continue CBG checks q8h - add insulin coverage if needed.   TPN lab panels on Mondays & Thursdays.  Follow for toleration of diet and potential to further taper TPN  F/u daily.  Cindi CarbonMary M Tyffany Waldrop, PharmD 04/01/19 9:30 AM

## 2019-04-01 NOTE — Progress Notes (Signed)
Nutrition Follow-up  RD working remotely.   DOCUMENTATION CODES:   Not applicable  INTERVENTION:  - will increase boost breeze and prostat each to TID.  - continue to encourage PO intakes. - continue TPN/TPN wean per Pharmacy and Surgery. - weigh patient today.  - will follow-up for results from 48 hour calorie count.   NUTRITION DIAGNOSIS:   Inadequate oral intake related to inability to eat as evidenced by NPO status. -diet advanced, continues with inadequate intake.  GOAL:   Patient will meet greater than or equal to 90% of their needs -unmet with decrease in TPN rate and ongoing suboptimal PO intakes.  MONITOR:   PO intake, Supplement acceptance, Labs, Weight trends, Other (Comment)(TPN regimen)  REASON FOR ASSESSMENT:   Consult Assessment of nutrition requirement/status, Calorie Count  ASSESSMENT:   68 year old male who has not sought medical treatment in some time. He reported L inguinal hernia repair in 1991 with recurring bulging ~10 years ago. Patient reported it is usually reducible not particularly bothersome; he can usually massage it back in. Patient was admitted on 4/10 d/t worsening abdominal pain x2 days with constipation and no BM during that time. Patient had also reported progressive N/V.  Patient has not been weighed since 4/10. Diet re-advanced from FLD to Soft today at 8:00 AM. No intakes documented since 50% of lunch on 4/21. Per review of orders, patient has accepted 5 of 7 cartons of boost breeze offered and 5 out of 7 packets or prostat offered; will increase these from BID to TID.    Per Surgery note today: ileus appears to be improving/resolving.  Yesterday custom TPN was reduced from 90 ml/hr to 45 ml/hr in the hopes of stimulating appetite/encouraging increased PO intake.    Medications reviewed; 17 g miralax BID, 250 mg florastor BID. Labs reviewed; CBGs: 101 and 117 mg/dl, BUN: 27 mg/dl, Ca: 8.2 mg/dl.     Diet Order:   Diet Order            DIET SOFT Room service appropriate? Yes; Fluid consistency: Thin  Diet effective now              EDUCATION NEEDS:   Not appropriate for education at this time  Skin:  Skin Assessment: Skin Integrity Issues: Skin Integrity Issues:: Incisions Incisions: abdomen (4/10)  Last BM:  4/23  Height:   Ht Readings from Last 1 Encounters:  03/18/19 5\' 9"  (1.753 m)    Weight:   Wt Readings from Last 1 Encounters:  03/18/19 72.6 kg    Ideal Body Weight:  72.73 kg  BMI:  Body mass index is 23.63 kg/m.  Estimated Nutritional Needs:   Kcal:  0932-3557 kcal  Protein:  100-115 grams  Fluid:  >/= 2 L/day     Trenton Gammon, MS, RD, LDN, [facility] Inpatient Clinical Dietitian Pager # [phone] After hours/weekend pager # [phone]

## 2019-04-01 NOTE — Progress Notes (Addendum)
[facility] Progress Note  14 Days Post-Op  Subjective: CC-  Feels like he is making progress. Tolerating some full liquids, but not eating much at all. Still nervous about over eating and making his abdominal pain worse. Denies n/v. Hiccups less. Passing flatus and he had a BM this morning.  Objective: Vital signs in last 24 hours: Temp:  [97.6 F (36.4 C)-99.3 F (37.4 C)] 97.6 F (36.4 C) (04/24 0517) Pulse Rate:  [88-93] 90 (04/24 0517) Resp:  [17-18] 17 (04/24 0517) BP: (118-119)/(66-73) 118/68 (04/24 0517) SpO2:  [97 %-100 %] 100 % (04/24 0517) Last BM Date: 03/31/19  Intake/Output from previous day: 04/23 0701 - 04/24 0700 In: 1172.3 [P.O.:450; I.V.:614.5; IV Piggyback:107.7] Out: 1205 [Urine:1175; Drains:30] Intake/Output this shift: No intake/output data recorded.  PE: Gen: Alert, NAD HEENT: EOM's intact, pupils equal and round Card: RRR Pulm: CTAB, no W/R/R,effort normal Abd: Soft,mild distension, nontender, +BS,drain with trace serosanguinous output, incisions cdi without erythema or drainage Skin: warm and dry   Lab Results:  Recent Labs    03/31/19 0502  WBC 9.4  HGB 9.8*  HCT 30.9*  PLT 690*   BMET Recent Labs    03/31/19 0502 04/01/19 0334  NA 134* 134*  K 3.7 3.9  CL 101 102  CO2 25 25  GLUCOSE 148* 118*  BUN 24* 27*  CREATININE 0.89 0.92  CALCIUM 8.2* 8.2*   PT/INR No results for input(s): LABPROT, INR in the last 72 hours. CMP     Component Value Date/Time   NA 134 (L) 04/01/2019 0334   K 3.9 04/01/2019 0334   CL 102 04/01/2019 0334   CO2 25 04/01/2019 0334   GLUCOSE 118 (H) 04/01/2019 0334   BUN 27 (H) 04/01/2019 0334   CREATININE 0.92 04/01/2019 0334   CALCIUM 8.2 (L) 04/01/2019 0334   PROT 6.7 03/31/2019 0502   ALBUMIN 2.3 (L) 03/31/2019 0502   AST 25 03/31/2019 0502   ALT 33 03/31/2019 0502   ALKPHOS 216 (H) 03/31/2019 0502   BILITOT 0.8 03/31/2019 0502   GFRNONAA >60 04/01/2019 0334   GFRAA >60  04/01/2019 0334   Lipase     Component Value Date/Time   LIPASE 24 03/18/2019 1601       Studies/Results: Ct Abdomen Pelvis W Contrast  Result Date: 03/30/2019 CLINICAL DATA:  Abdominal infection. EXAM: CT ABDOMEN AND PELVIS WITH CONTRAST TECHNIQUE: Multidetector CT imaging of the abdomen and pelvis was performed using the standard protocol following bolus administration of intravenous contrast. CONTRAST:  OMNIPAQUE IOHEXOL 300 MG/ML SOLN, 43mL OMNIPAQUE IOHEXOL 300 MG/ML SOLN COMPARISON:  CT scan of March 24, 2019. FINDINGS: Lower chest: Mild bilateral pleural effusions are noted with adjacent subsegmental atelectasis. Hepatobiliary: No focal liver abnormality is seen. No gallstones, gallbladder wall thickening, or biliary dilatation. Pancreas: Unremarkable. No pancreatic ductal dilatation or surrounding inflammatory changes. Spleen: Stable calcified splenic granulomata are noted. Adrenals/Urinary Tract: Adrenal glands are unremarkable. Kidneys are normal, without renal calculi, focal lesion, or hydronephrosis. Bladder is unremarkable. Stomach/Bowel: The stomach is unremarkable. The appendix appears normal. No definite evidence of bowel obstruction is noted. Postsurgical changes are seen involving the small bowel in the left lower quadrant of the abdomen. Mild wall and fold thickening is seen involving the small bowel and the left lower quadrant consistent with focal inflammation. Vascular/Lymphatic: Aortic atherosclerosis. No enlarged abdominal or pelvic lymph nodes. Reproductive: There are again noted 2 moderate size inguinal hernias, left greater than right, both of which are filled with fluid.  Stable position of surgical drain is seen anteriorly in the pelvis. Other: Small amount of free fluid is noted in the dependent portion of the pelvis. 23 x 12 mm fluid collection is noted in the left lower quadrant best seen on image number 57 of series 2 which is significantly smaller compared to  prior exam. Ill-defined fluid collection measuring 24 x 19 mm is noted in the left side of the abdomen best seen on image number 51 of series 2 which is not significantly changed compared to prior exam. Grossly stable fluid collection measuring 54 x 19 mm is noted anteriorly in the left lower quadrant best seen on image number 66 of series 2. Musculoskeletal: No acute or significant osseous findings. IMPRESSION: Status post small bowel resection. Continued presence of inflammatory wall and fold thickening is seen involving small bowel loops in the pelvis. Several small fluid collections are noted in the left side of the abdomen, most of which are stable in size compared to prior exam, although at least 1 is significantly smaller compared to prior exam. Continued presence of 2 moderate size inguinal hernias, left greater than right, both of which are filled with fluid. Surgical drain is unchanged in position. Mild bilateral pleural effusions are noted with adjacent subsegmental atelectasis. Aortic Atherosclerosis (ICD10-I70.0). Electronically Signed   By: Lupita RaiderJames  Green Jr M.D.   On: 03/30/2019 14:11    Anti-infectives: Anti-infectives (From admission, onward)   Start     Dose/Rate Route Frequency Ordered Stop   03/31/19 1400  piperacillin-tazobactam (ZOSYN) IVPB 3.375 g     3.375 g 12.5 mL/hr over 240 Minutes Intravenous Every 8 hours 03/31/19 0817     03/29/19 1400  piperacillin-tazobactam (ZOSYN) IVPB 3.375 g  Status:  Discontinued     3.375 g 12.5 mL/hr over 240 Minutes Intravenous Every 8 hours 03/29/19 1238 03/31/19 0817   03/24/19 1200  piperacillin-tazobactam (ZOSYN) IVPB 3.375 g     3.375 g 12.5 mL/hr over 240 Minutes Intravenous Every 8 hours 03/24/19 1120 03/29/19 1007   03/19/19 0200  piperacillin-tazobactam (ZOSYN) IVPB 3.375 g  Status:  Discontinued     3.375 g 12.5 mL/hr over 240 Minutes Intravenous Every 8 hours 03/19/19 0028 03/19/19 0036   03/19/19 0200  piperacillin-tazobactam  (ZOSYN) IVPB 3.375 g     3.375 g 12.5 mL/hr over 240 Minutes Intravenous Every 8 hours 03/19/19 0036 03/24/19 0659   03/18/19 2038  clindamycin (CLEOCIN) 900 mg, gentamicin (GARAMYCIN) 240 mg in sodium chloride 0.9 % 1,000 mL for intraperitoneal lavage  Status:  Discontinued       As needed 03/18/19 2038 03/18/19 2253   03/18/19 1845  ceFAZolin (ANCEF) IVPB 2g/100 mL premix     2 g 200 mL/hr over 30 Minutes Intravenous On call to O.R. 03/18/19 1747 03/18/19 1953   03/18/19 1845  metroNIDAZOLE (FLAGYL) IVPB 500 mg     500 mg 100 mL/hr over 60 Minutes Intravenous On call to O.R. 03/18/19 1747 03/18/19 1953   03/18/19 1830  clindamycin (CLEOCIN) 900 mg, gentamicin (GARAMYCIN) 240 mg in sodium chloride 0.9 % 1,000 mL for intraperitoneal lavage  Status:  Discontinued    Note to Pharmacy:  Have in the  OR room for final irrigation in bowel surgery case to minimize risk of abscess/infection Pharmacy may adjust dosing strength, schedule, rate of infusion, etc as needed to optimize therapy    Irrigation To Surgery 03/18/19 1828 03/19/19 0002   03/18/19 1806  metroNIDAZOLE (FLAGYL) 5-0.79 MG/ML-% IVPB  Note to Pharmacy:  [name]   : cabinet override      03/18/19 1806 03/18/19 1923   03/18/19 1806  ceFAZolin (ANCEF) 2-4 GM/100ML-% IVPB    Note to Pharmacy:  [name]   : cabinet override      03/18/19 1806 03/18/19 1923       Assessment/Plan S/p laparoscopic BIH repairs with absorbable Phasix mesh4/10 Dr. 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  -cont JP drain for now and follow, will remove prior to d/c -CTscan4/16reviewed withIR, nothing really to drain -repeat CT scan 4/22 with several persistent small fluid collections stable in size or smaller than on previous scan, no drainable fluid collection, no evidence of bowel obstruction  ARF- resolved,  Cr0.92 Tachycardia -resolved tachypnea/dyspnea-echo and duplex 4/27fine, improving, medicine consulted and s/o AMS -resolved Hypokalemia-3.9today, resolved  FEN- 1/2 TPN, soft diet VTE-Lovenox, SCDs ID-Zosyn 4/11>>4/24. WBC normalized, afebrile Foley - none Follow up - Dr. Leanne Chang - daughter Byrd Hesselbach [phone]  Plan:Continue TPN at 1/2 rate and advance to soft diet. Will ask dietician to reevaluate and start calorie count. D/c zosyn today. Continue PT/mobilization.   LOS: 14 days    Franne Forts , [facility] 04/01/2019, 7:56 AM Pager: [phone] Mon-Thurs 7:00 am-4:30 pm Fri 7:00 am -11:30 AM Sat-Sun 7:00 am-11:30 am

## 2019-04-01 NOTE — Discharge Summary (Addendum)
[facility] Discharge Summary   Patient ID: Omar Bell MRN: [id number] DOB/AGE: June 14, 1951 68 y.o.  Admit date: 03/18/2019 Discharge date: 04/04/2019  Admitting Diagnosis: Recurrent incarcerated left inguinal hernia.  Probably strangulated. Right inguinal hernia  Discharge Diagnosis -Recurrent left inguinal hernia incarcerated with gangrenous perforated  strangulated jejunum causing small bowel obstruction Right indirect inguinal hernia Right femoral hernia  ARF  AMS Moderate Malnutrition  Patient Active Problem List   Diagnosis Date Noted  . AKI (acute kidney injury) (HCC) 03/20/2019  . 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 Perforation of small intestine s/p SB resection 03/19/2019 03/19/2019  . SBO (small bowel obstruction) from hernia 03/19/2019  . Personal history of noncompliance with medical treatment 03/19/2019  . Colonoscopy refused 03/19/2019  . Strangulated recurrent left inguinal hernia s/p lap repair 03/19/2019 03/18/2019  . Right inguinal hernia s/p lap repair 03/19/2019 03/18/2019  . Asthma 03/18/2019  . Nausea & vomiting 03/18/2019  . Hypokalemia 03/18/2019  . Dehydration 03/18/2019  . Hyperlipidemia   . Abdominal pain 11/12/2016    Consultants Internal medicine  Imaging: No results found.  Procedures Dr. Michaell CowingGross (03/18/19) - Laparoscopic lysis of adhesions, Small bowel resection (extracorporeal), Laparoscopic bilateral inguinal and right femoral hernia repairs with absorbable Phasix mesh (TAPP approach), TAP block  Hospital Course:  Omar Bell is a 68yo male who [facility] with 48 hours of worsening left groin pain/swelling and progressive nausea/vomiting.  Workup showed leukocytosis and left groin mass with erythema and pain highly suspicious for recurrent left inguinal hernia with strangulation.  Patient was taken emergently to the operating room for exploration.   Intraoperatively he was found to have a recurrent left inguinal hernia incarcerated with gangrenous perforated strangulated jejunum causing small bowel obstruction, as well as a right indirect inguinal and right femoral hernia. Patient required Laparoscopic lysis of adhesions, small bowel resection (extracorporeal), bilateral inguinal and right femoral hernia repairs with absorbable Phasix mesh (TAPP approach), and TAP block. On POD#3 patient was noted to be tachycardic, tachypneic, and short of breath with altered mental status. Internal medicine was consulted for assistance with workup/management. No acute findings other than patient was found to be fluid overloaded and was therefore diuresed with lasix. Patient had a persistent ileus for several days postop therefore he was started on TPN for nutritional support 4/17. Due to worsening leukocytosis a CT abdomen/pelvis was obtained 4/16; this revealed multiple small fluid collections, nothing big enough to warrant drainage. CT abdomen/pelvis repeated 4/22 which was overall stable, several persistent small fluid collections stable in size or smaller than on previous scan with no drainable fluid collection and no evidence of bowel obstruction. Leukocytosis resolved and IV zosyn was stopped 4/24 after a 14 day course. Ileus slowly resolved and diet was advanced as tolerated. Dietician was consulted and assisted with a calorie count. Once appetite improved the patient was weaned off of TPN on 04/03/19.  Patient worked with therapies during this admission who recommended home with home health PT when medically stable for discharge. On POD 17, the patient was voiding well, tolerating diet, ambulating well, pain well controlled, vital signs stable, incisions c/d/i and felt stable for discharge home.  Patient will follow up as below and knows to call with questions or concerns.   Home health including; nursing evaluation, OT & PT evaluation.   I have personally  reviewed the patients medication history on the [place] controlled substance database. He was sent home just on Tylenol for  pain, he was taking no narcotics in the hospital at the time of discharge.    I was not directly involved in this patient's care therefore the information in this discharge summary was taken from the chart.  Physical Exam: General appearance: alert, cooperative and no distress Resp: clear to auscultation bilaterally GI: soft, non-tender; bowel sounds normal; no masses,  no organomegaly and Incisions are all healing nicely, drain is serous.  He reports having bowel movements.  He is not taking anything for pain. Extremities: extremities normal, atraumatic, no cyanosis or edema   Allergies as of 04/04/2019      Reactions   Pork-derived Products Nausea And Vomiting      Medication List    TAKE these medications   acetaminophen 325 MG tablet Commonly known as:  TYLENOL Take 2 tablets (650 mg total) by mouth every 6 (six) hours as needed for moderate pain, fever or headache.            Durable Medical Equipment  (From admission, onward)         Start     Ordered   03/29/19 1212  For home use only DME 4 wheeled rolling walker with seat  Once    Question:  Patient needs a walker to treat with the following condition  Answer:  Incarcerated left inguinal hernia   03/29/19 1211           Follow-up Information    Karie Soda, MD. Call on 04/18/2019.   Specialty:  General Surgery Why:  5/11 at 10:45. Due to coronavirus we are decreasing foot traffic in office. Instead of coming to an appt a provider will call you at the above date/time. Send picture of your incision with your name and date of birth to [email or web address] Contact information: [address] [place] [address] [phone]        Benita Stabile, MD. Call.   Specialty:  Internal Medicine Why:  call to arrange post-hospitalization follow up appointment with your  primary care physician Contact information: [address] [place] [address] [phone]           Signed: Matilde Sprang [facility] 04/04/2019, 4:01 PM Pager: [phone] Mon-Thurs 7:00 am-4:30 pm Fri 7:00 am -11:30 AM Sat-Sun 7:00 am-11:30 am

## 2019-04-02 LAB — BASIC METABOLIC PANEL
Anion gap: 10 (ref 5–15)
BUN: 24 mg/dL — ABNORMAL HIGH (ref 8–23)
CO2: 23 mmol/L (ref 22–32)
Calcium: 8.4 mg/dL — ABNORMAL LOW (ref 8.9–10.3)
Chloride: 102 mmol/L (ref 98–111)
Creatinine, Ser: 1 mg/dL (ref 0.61–1.24)
GFR calc Af Amer: >60 mL/min (ref >60)
GFR calc non Af Amer: >60 mL/min (ref >60)
Glucose, Bld: 126 mg/dL — ABNORMAL HIGH (ref 70–99)
Potassium: 3.9 mmol/L (ref 3.5–5.1)
Sodium: 135 mmol/L (ref 135–145)

## 2019-04-02 LAB — MAGNESIUM: Magnesium: 2.3 mg/dL (ref 1.7–2.4)

## 2019-04-02 LAB — GLUCOSE, CAPILLARY
Glucose-Capillary: 126 mg/dL — ABNORMAL HIGH (ref 70–99)
Glucose-Capillary: 137 mg/dL — ABNORMAL HIGH (ref 70–99)

## 2019-04-02 LAB — PHOSPHORUS: Phosphorus: 3.8 mg/dL (ref 2.5–4.6)

## 2019-04-02 MED ORDER — TRAVASOL 10 % IV SOLN
INTRAVENOUS | Status: DC
  Administered 2019-04-02: 17:00:00 via INTRAVENOUS
  Filled 2019-04-02: qty 561.6

## 2019-04-02 NOTE — Progress Notes (Signed)
PHARMACY - ADULT TOTAL PARENTERAL NUTRITION CONSULT NOTE   Pharmacy Consult for TPN Indication: Prolonged ileus  Patient Measurements: Height: 5\' 9"  (175.3 cm) Weight: 148 lb 6.4 oz (67.3 kg) IBW/kg (Calculated) : 70.7 TPN AdjBW (KG): 72.6 Body mass index is 21.91 kg/m.  Current Nutrition: Soft diet  IVF: D5 1/2 NS with 20 KCl at 10 mL/hr  Central access: Double lumen PICC placed 4/17 TPN start date: 4/17  ASSESSMENT                                                                                        HPI: 68 yo male presents 03/18/19 with nausea & vomiting. He has a history of left inguinal hernia repair in 1991. He was taken for emergent to surgery for incarcerated leftinguinal hernia perforation of the jejunumcausingSBO, underwent laparoscopiclysis of adhesions, small bowel resection, lap bil inguinal and right femoral hernia repairs with absorbable mesh on 03/18/2019.  Significant events:  -4/10: Surgery -4/17: TPN started -4/21: Diet advanced to clear liquid -4/22: Diet advanced to full liquid -4/23: TPN reduced to 1/2 of goal rate. Lipids added to TPN. -4/24: Diet advanced to soft -4/25: continue TPN  Insulin Requirements: n/a  Today, 04/02/19  Glucose: No hx DM, CBGs in goal rage of 100-150. No SSI ordered.  Electrolytes: WNL, CorrCa (9.8)   Renal: SCr WNL and stable  LFTs: AST/ALT WNL, AlkPhos remains elevated, slightly increased  TGs: 496 (4/17) 236 (4/20), 227 (4/23) - note hx HLD - TGs much improved and stable  Prealbumin: 7.4 (4/17), 11.7 (4/20) - improving  NUTRITIONAL GOALS                                                                           RD recs (4/21): Kcal:  [id number] kcal Protein:  100-115 grams Fluid:  >/= 2 L/day  Custom TPN at goal rate of 90 mL/hr provides: -  112 g/day protein -  432 g/day Dextrose (20 %) -  43 g/day Lipid -  2350 Kcal/day  Pharmacy received consult from surgery to decrease TPN to half of goal rate starting  on 4/23  PLAN                                                                                                        At 1800 today:  Continue custom TPN at 45 mL/hr (1/2 of goal rate). Hopefully this will help to stimulate appetite.   Electrolytes in  TPN: no changes. Continue standard concentration except increased Na and K, Cl:Ac ratio 1:2  TPN to contain standard MVI but to give trace elements only on MWF due to current national shortage  Continue IVF at 86ml/hr per MD  Continue CBG checks q8h - add insulin coverage if needed.   TPN lab panels on Mondays & Thursdays.  BMET, Mag level in am  F/u daily.  Otho Bellows, PharmD 04/02/19 9:16 AM

## 2019-04-02 NOTE — Progress Notes (Signed)
15 Days Post-Op   Subjective/Chief Complaint: Comfortable from a pain standpoint Appetite still poor Having BM's   Objective: Vital signs in last 24 hours: Temp:  [98 F (36.7 C)-98.6 F (37 C)] 98 F (36.7 C) (04/25 0548) Pulse Rate:  [83-99] 86 (04/25 0548) Resp:  [16-18] 16 (04/25 0548) BP: (106-120)/(66-73) 106/67 (04/25 0548) SpO2:  [97 %-100 %] 97 % (04/25 0548) Weight:  [67.3 kg] 67.3 kg (04/24 1503) Last BM Date: 04/01/19  Intake/Output from previous day: 04/24 0701 - 04/25 0700 In: 1513.2 [P.O.:180; I.V.:1213.2; IV Piggyback:120] Out: 1225 [Urine:1200; Drains:25] Intake/Output this shift: No intake/output data recorded.  Exam: Awake and alert Abdomen soft, incision clean Drain serosang  Lab Results:  Recent Labs    03/31/19 0502  WBC 9.4  HGB 9.8*  HCT 30.9*  PLT 690*   BMET Recent Labs    04/01/19 0334 04/02/19 0616  NA 134* 135  K 3.9 3.9  CL 102 102  CO2 25 23  GLUCOSE 118* 126*  BUN 27* 24*  CREATININE 0.92 1.00  CALCIUM 8.2* 8.4*   PT/INR No results for input(s): LABPROT, INR in the last 72 hours. ABG No results for input(s): PHART, HCO3 in the last 72 hours.  Invalid input(s): PCO2, PO2  Studies/Results: No results found.  Anti-infectives: Anti-infectives (From admission, onward)   Start     Dose/Rate Route Frequency Ordered Stop   03/31/19 1400  piperacillin-tazobactam (ZOSYN) IVPB 3.375 g     3.375 g 12.5 mL/hr over 240 Minutes Intravenous Every 8 hours 03/31/19 0817 04/01/19 2359   03/29/19 1400  piperacillin-tazobactam (ZOSYN) IVPB 3.375 g  Status:  Discontinued     3.375 g 12.5 mL/hr over 240 Minutes Intravenous Every 8 hours 03/29/19 1238 03/31/19 0817   03/24/19 1200  piperacillin-tazobactam (ZOSYN) IVPB 3.375 g     3.375 g 12.5 mL/hr over 240 Minutes Intravenous Every 8 hours 03/24/19 1120 03/29/19 1007   03/19/19 0200  piperacillin-tazobactam (ZOSYN) IVPB 3.375 g  Status:  Discontinued     3.375 g 12.5 mL/hr over  240 Minutes Intravenous Every 8 hours 03/19/19 0028 03/19/19 0036   03/19/19 0200  piperacillin-tazobactam (ZOSYN) IVPB 3.375 g     3.375 g 12.5 mL/hr over 240 Minutes Intravenous Every 8 hours 03/19/19 0036 03/24/19 0659   03/18/19 2038  clindamycin (CLEOCIN) 900 mg, gentamicin (GARAMYCIN) 240 mg in sodium chloride 0.9 % 1,000 mL for intraperitoneal lavage  Status:  Discontinued       As needed 03/18/19 2038 03/18/19 2253   03/18/19 1845  ceFAZolin (ANCEF) IVPB 2g/100 mL premix     2 g 200 mL/hr over 30 Minutes Intravenous On call to O.R. 03/18/19 1747 03/18/19 1953   03/18/19 1845  metroNIDAZOLE (FLAGYL) IVPB 500 mg     500 mg 100 mL/hr over 60 Minutes Intravenous On call to O.R. 03/18/19 1747 03/18/19 1953   03/18/19 1830  clindamycin (CLEOCIN) 900 mg, gentamicin (GARAMYCIN) 240 mg in sodium chloride 0.9 % 1,000 mL for intraperitoneal lavage  Status:  Discontinued    Note to Pharmacy:  Have in the  OR room for final irrigation in bowel surgery case to minimize risk of abscess/infection Pharmacy may adjust dosing strength, schedule, rate of infusion, etc as needed to optimize therapy    Irrigation To Surgery 03/18/19 1828 03/19/19 0002   03/18/19 1806  metroNIDAZOLE (FLAGYL) 5-0.79 MG/ML-% IVPB    Note to Pharmacy:  [name]   : cabinet override      03/18/19  1806 03/18/19 1923   03/18/19 1806  ceFAZolin (ANCEF) 2-4 GM/100ML-% IVPB    Note to Pharmacy:  Marney DoctorMentley, Pamela   : cabinet override      03/18/19 1806 03/18/19 1923      Assessment/Plan: s/p Procedure(s): DIAGNOSTIC LAPAROSCOPY, SMALL BOWEL RESECTION, REPAIR OF LEFT STRANGULATED RECURRENT INGUINAL HERNIA WITH GANGRENE; RIGHT INGUINAL HERNIA REPAIR (Bilateral)  Continuing with TNA, calorie counts and working with PT Antibiotics stopped. Encourage PO intake  LOS: 15 days    Omar Bell 04/02/2019

## 2019-04-02 NOTE — Progress Notes (Signed)
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  He was told earlier this week he could go home this weekend, now it's more like next week.  He understands the medical reason but he admits he is emotionally exhausted/frustarted but also very appreciative of all the staff apologizing for his behavior from an earlier event.     Assisted with amb around unit 4 times using his new Rollator at a functional safe/speed as he conversed and shared his feeling. Pt progressing well with his mobility.    Follow Up Recommendations  Home health PT;Supervision for mobility/OOB     Equipment Recommendations       Recommendations for Other Services       Precautions / Restrictions Precautions Precautions: Fall Precaution Comments: JP Drain Restrictions Weight Bearing Restrictions: No    Mobility  Bed Mobility               General bed mobility comments: OOB in recliner   Transfers Overall transfer level: Needs assistance Equipment used: 4-wheeled walker Transfers: Sit to/from Stand Sit to Stand: Supervision Stand pivot transfers: Supervision       General transfer comment: verbal cues for hand placement and IV line caution  Ambulation/Gait Ambulation/Gait assistance: Supervision Gait Distance (Feet): 800 Feet Assistive device: 4-wheeled walker Gait Pattern/deviations: Step-through pattern;Trunk flexed;Decreased stride length Gait velocity: WNL   General Gait Details: required NO seated rest breaks at a Mod rate   Stairs              Wheelchair Mobility    Modified Rankin (Stroke Patients Only)       Balance                                            Cognition Arousal/Alertness: Awake/alert Behavior During Therapy: WFL for tasks assessed/performed Overall Cognitive Status: Within Functional Limits for tasks assessed                                        Exercises      General Comments        Pertinent Vitals/Pain Pain Assessment: No/denies pain    Home Living                      Prior Function            PT Goals (current goals can now be found in the care plan section) Progress towards PT goals: Progressing toward goals    Frequency    Min 3X/week      PT Plan Current plan remains appropriate    Co-evaluation              AM-PAC PT "6 Clicks" Mobility   Outcome Measure  Help needed turning from your back to your side while in a flat bed without using bedrails?: None Help  needed moving from lying on your back to sitting on the side of a flat bed without using bedrails?: None Help needed moving to and from a bed to a chair (including a wheelchair)?: A Little Help needed standing up from a chair using your arms (e.g., wheelchair or bedside chair)?: A Little Help needed to walk in hospital room?: A Little Help needed climbing 3-5 steps with a railing? : A Little 6 Click Score: 20    End of Session Equipment Utilized During Treatment: Gait belt Activity Tolerance: Patient tolerated treatment well Patient left: in chair;with call bell/phone within reach(Rocking Chair) Nurse Communication: Mobility status PT Visit Diagnosis: Other abnormalities of gait and mobility (R26.89)     Time: 2778-2423 PT Time Calculation (min) (ACUTE ONLY): 28 min  Charges:  $Gait Training: 23-37 mins                     [name]  PTA Acute  Rehabilitation Services Pager      [phone] Office      [phone]

## 2019-04-02 NOTE — Progress Notes (Signed)
Called to pt room by NT. Pt concerned that he was repeatedly asked to slow down his gait. Pt walking very fast and insists on wearing sunglasses inside. Safety and fall precautions explained to pt. Will continue to monitor

## 2019-04-03 LAB — BASIC METABOLIC PANEL
Anion gap: 7 (ref 5–15)
BUN: 25 mg/dL — ABNORMAL HIGH (ref 8–23)
CO2: 24 mmol/L (ref 22–32)
Calcium: 8.3 mg/dL — ABNORMAL LOW (ref 8.9–10.3)
Chloride: 104 mmol/L (ref 98–111)
Creatinine, Ser: 0.84 mg/dL (ref 0.61–1.24)
GFR calc Af Amer: >60 mL/min (ref >60)
GFR calc non Af Amer: >60 mL/min (ref >60)
Glucose, Bld: 123 mg/dL — ABNORMAL HIGH (ref 70–99)
Potassium: 3.7 mmol/L (ref 3.5–5.1)
Sodium: 135 mmol/L (ref 135–145)

## 2019-04-03 LAB — GLUCOSE, CAPILLARY
Glucose-Capillary: 127 mg/dL — ABNORMAL HIGH (ref 70–99)
Glucose-Capillary: 92 mg/dL (ref 70–99)

## 2019-04-03 LAB — MAGNESIUM: Magnesium: 2.1 mg/dL (ref 1.7–2.4)

## 2019-04-03 MED ORDER — POTASSIUM CHLORIDE 10 MEQ/50ML IV SOLN
10.0000 meq | INTRAVENOUS | Status: AC
  Administered 2019-04-03 (×2): 10 meq via INTRAVENOUS
  Filled 2019-04-03 (×2): qty 50

## 2019-04-03 MED ORDER — TRAVASOL 10 % IV SOLN: INTRAVENOUS | Status: AC

## 2019-04-03 NOTE — Progress Notes (Signed)
16 Days Post-Op   Subjective/Chief Complaint: No complaints Appetite still not great Having BM's No nausea   Objective: Vital signs in last 24 hours: Temp:  [97.5 F (36.4 C)-98.8 F (37.1 C)] 97.5 F (36.4 C) (04/26 0743) Pulse Rate:  [93-98] 93 (04/26 0743) Resp:  [16-20] 16 (04/26 0743) BP: (101-114)/(68-77) 114/77 (04/26 0743) SpO2:  [99 %-100 %] 99 % (04/26 0743) Last BM Date: 04/03/19  Intake/Output from previous day: 04/25 0701 - 04/26 0700 In: 1236.5 [I.V.:1236.5] Out: 1445 [Urine:1400; Drains:45] Intake/Output this shift: No intake/output data recorded.  Exam: Looks comfortable Abdomen soft, incision healing well Drain serosang  Lab Results:  No results for input(s): WBC, HGB, HCT, PLT in the last 72 hours. BMET Recent Labs    04/02/19 0616 04/03/19 0518  NA 135 135  K 3.9 3.7  CL 102 104  CO2 23 24  GLUCOSE 126* 123*  BUN 24* 25*  CREATININE 1.00 0.84  CALCIUM 8.4* 8.3*   PT/INR No results for input(s): LABPROT, INR in the last 72 hours. ABG No results for input(s): PHART, HCO3 in the last 72 hours.  Invalid input(s): PCO2, PO2  Studies/Results: No results found.  Anti-infectives: Anti-infectives (From admission, onward)   Start     Dose/Rate Route Frequency Ordered Stop   03/31/19 1400  piperacillin-tazobactam (ZOSYN) IVPB 3.375 g     3.375 g 12.5 mL/hr over 240 Minutes Intravenous Every 8 hours 03/31/19 0817 04/01/19 2359   03/29/19 1400  piperacillin-tazobactam (ZOSYN) IVPB 3.375 g  Status:  Discontinued     3.375 g 12.5 mL/hr over 240 Minutes Intravenous Every 8 hours 03/29/19 1238 03/31/19 0817   03/24/19 1200  piperacillin-tazobactam (ZOSYN) IVPB 3.375 g     3.375 g 12.5 mL/hr over 240 Minutes Intravenous Every 8 hours 03/24/19 1120 03/29/19 1007   03/19/19 0200  piperacillin-tazobactam (ZOSYN) IVPB 3.375 g  Status:  Discontinued     3.375 g 12.5 mL/hr over 240 Minutes Intravenous Every 8 hours 03/19/19 0028 03/19/19 0036   03/19/19 0200  piperacillin-tazobactam (ZOSYN) IVPB 3.375 g     3.375 g 12.5 mL/hr over 240 Minutes Intravenous Every 8 hours 03/19/19 0036 03/24/19 0659   03/18/19 2038  clindamycin (CLEOCIN) 900 mg, gentamicin (GARAMYCIN) 240 mg in sodium chloride 0.9 % 1,000 mL for intraperitoneal lavage  Status:  Discontinued       As needed 03/18/19 2038 03/18/19 2253   03/18/19 1845  ceFAZolin (ANCEF) IVPB 2g/100 mL premix     2 g 200 mL/hr over 30 Minutes Intravenous On call to O.R. 03/18/19 1747 03/18/19 1953   03/18/19 1845  metroNIDAZOLE (FLAGYL) IVPB 500 mg     500 mg 100 mL/hr over 60 Minutes Intravenous On call to O.R. 03/18/19 1747 03/18/19 1953   03/18/19 1830  clindamycin (CLEOCIN) 900 mg, gentamicin (GARAMYCIN) 240 mg in sodium chloride 0.9 % 1,000 mL for intraperitoneal lavage  Status:  Discontinued    Note to Pharmacy:  Have in the  OR room for final irrigation in bowel surgery case to minimize risk of abscess/infection Pharmacy may adjust dosing strength, schedule, rate of infusion, etc as needed to optimize therapy    Irrigation To Surgery 03/18/19 1828 03/19/19 0002   03/18/19 1806  metroNIDAZOLE (FLAGYL) 5-0.79 MG/ML-% IVPB    Note to Pharmacy:  [name]   : cabinet override      03/18/19 1806 03/18/19 1923   03/18/19 1806  ceFAZolin (ANCEF) 2-4 GM/100ML-% IVPB    Note to Pharmacy:  Marney DoctorMentley, Pamela   : cabinet override      03/18/19 1806 03/18/19 1923      Assessment/Plan: s/p Procedure(s): DIAGNOSTIC LAPAROSCOPY, SMALL BOWEL RESECTION, REPAIR OF LEFT STRANGULATED RECURRENT INGUINAL HERNIA WITH GANGRENE; RIGHT INGUINAL HERNIA REPAIR (Bilateral)  Working better with PT.  Will need home health PT at discharge Check labs tomorrow Working on calorie counts Home soon   LOS: 16 days    Abigail MiyamotoDouglas Aaban Griep 04/03/2019

## 2019-04-03 NOTE — Progress Notes (Signed)
PHARMACY - ADULT TOTAL PARENTERAL NUTRITION CONSULT NOTE   Pharmacy Consult for TPN Indication: Prolonged ileus  Patient Measurements: Height: 5\' 9"  (175.3 cm) Weight: 148 lb 6.4 oz (67.3 kg) IBW/kg (Calculated) : 70.7 TPN AdjBW (KG): 72.6 Body mass index is 21.91 kg/m.  Current Nutrition: Soft diet  IVF: D5 1/2 NS with 20 KCl at 10 mL/hr  Central access: Double lumen PICC placed 4/17 TPN start date: 4/17  ASSESSMENT                                                                                        HPI: 68 yo male presents 03/18/19 with nausea & vomiting. He has a history of left inguinal hernia repair in 1991. He was taken for emergent to surgery for incarcerated leftinguinal hernia perforation of the jejunumcausingSBO, underwent laparoscopiclysis of adhesions, small bowel resection, lap bil inguinal and right femoral hernia repairs with absorbable mesh on 03/18/2019.  Significant events:  -4/10: Surgery -4/17: TPN started -4/21: Diet advanced to clear liquid -4/22: Diet advanced to full liquid -4/23: TPN reduced to 1/2 of goal rate. Lipids added to TPN. -4/24: Diet advanced to soft -4/26: Discontinue TPN today per discussion with surgeon  Insulin Requirements: n/a  Today, 04/03/19  Glucose: No hx DM, CBGs in goal rage of 100-150. No SSI ordered.  Electrolytes: WNL  Renal: SCr WNL and stable  LFTs: AST/ALT WNL, AlkPhos remains elevated, slightly increased  TGs: 496 (4/17) 236 (4/20), 227 (4/23) - note hx HLD - TGs much improved and stable  Prealbumin: 7.4 (4/17), 11.7 (4/20) - improving  NUTRITIONAL GOALS                                                                           RD recs (4/21): Kcal:  0349-1791 kcal Protein:  100-115 grams Fluid:  >/= 2 L/day  Custom TPN at goal rate of 90 mL/hr provides: -  112 g/day protein -  432 g/day Dextrose (20 %) -  43 g/day Lipid -  2350 Kcal/day  Pharmacy received consult from surgery to decrease TPN to half  of goal rate starting on 4/23  PLAN                                                                                                        Now:   KCl 10 mEq IV x 2 runs  Cut TPN to 1/2 of current rate for 2 hours then discontinue  Will discontinue associated labs and nursing orders  Cindi CarbonMary M Bernardette Waldron, PharmD 04/03/19 9:36 AM

## 2019-04-04 LAB — COMPREHENSIVE METABOLIC PANEL
ALT: 29 U/L (ref 0–44)
AST: 22 U/L (ref 15–41)
Albumin: 2.4 g/dL — ABNORMAL LOW (ref 3.5–5.0)
Alkaline Phosphatase: 158 U/L — ABNORMAL HIGH (ref 38–126)
Anion gap: 8 (ref 5–15)
BUN: 27 mg/dL — ABNORMAL HIGH (ref 8–23)
CO2: 23 mmol/L (ref 22–32)
Calcium: 8.5 mg/dL — ABNORMAL LOW (ref 8.9–10.3)
Chloride: 105 mmol/L (ref 98–111)
Creatinine, Ser: 0.92 mg/dL (ref 0.61–1.24)
GFR calc Af Amer: >60 mL/min (ref >60)
GFR calc non Af Amer: >60 mL/min (ref >60)
Glucose, Bld: 101 mg/dL — ABNORMAL HIGH (ref 70–99)
Potassium: 3.9 mmol/L (ref 3.5–5.1)
Sodium: 136 mmol/L (ref 135–145)
Total Bilirubin: 0.8 mg/dL (ref 0.3–1.2)
Total Protein: 6.8 g/dL (ref 6.5–8.1)

## 2019-04-04 LAB — CBC
HCT: 31.1 % — ABNORMAL LOW (ref 39.0–52.0)
Hemoglobin: 9.7 g/dL — ABNORMAL LOW (ref 13.0–17.0)
MCH: 29.2 pg (ref 26.0–34.0)
MCHC: 31.2 g/dL (ref 30.0–36.0)
MCV: 93.7 fL (ref 80.0–100.0)
Platelets: 554 10*3/uL — ABNORMAL HIGH (ref 150–400)
RBC: 3.32 MIL/uL — ABNORMAL LOW (ref 4.22–5.81)
RDW: 12.6 % (ref 11.5–15.5)
WBC: 7.4 10*3/uL (ref 4.0–10.5)
nRBC: 0 % (ref 0.0–0.2)

## 2019-04-04 LAB — GLUCOSE, CAPILLARY
Glucose-Capillary: 97 mg/dL (ref 70–99)
Glucose-Capillary: 97 mg/dL (ref 70–99)

## 2019-04-04 MED ORDER — ACETAMINOPHEN 325 MG PO TABS: 650.0000 mg | ORAL_TABLET | Freq: Four times a day (QID) | ORAL | Status: AC | PRN

## 2019-04-04 NOTE — Progress Notes (Addendum)
17 Days Post-Op    CC: Abdominal pain  Subjective: Patient lying in bed.  He says he is eating and drinking well.  Having bowel movements.  Incisions are all healing nicely.  Drain is clear serosanguineous fluid.  He is moving between 1000-1200 on his incentive spirometer.  He says he does not use it very frequently.  Objective: Vital signs in last 24 hours: Temp:  [97.5 F (36.4 C)-98.4 F (36.9 C)] 98.4 F (36.9 C) (04/26 1510) Pulse Rate:  [93-98] 96 (04/27 0601) Resp:  [14-16] 14 (04/26 1510) BP: (104-125)/(62-77) 107/62 (04/27 0601) SpO2:  [97 %-99 %] 97 % (04/26 2233) Last BM Date: 04/03/19 120 PO 537 IV Urine 750 Drain 5 Stool x 1 Afebrile, VSS Creatinine 0.92  CT 4/22  Intake/Output from previous day: 04/26 0701 - 04/27 0700 In: 757 [P.O.:120; I.V.:537; IV Piggyback:100] Out: 755 [Urine:750; Drains:5] Intake/Output this shift: No intake/output data recorded.  General appearance: alert, cooperative and no distress Resp: clear to auscultation bilaterally GI: soft, non-tender; bowel sounds normal; no masses,  no organomegaly and Incisions are all healing nicely, drain is serous.  He reports having bowel movements.  He is not taking anything for pain. Extremities: extremities normal, atraumatic, no cyanosis or edema  Lab Results:  Recent Labs    04/04/19 0409  WBC 7.4  HGB 9.7*  HCT 31.1*  PLT 554*    BMET Recent Labs    04/03/19 0518 04/04/19 0409  NA 135 136  K 3.7 3.9  CL 104 105  CO2 24 23  GLUCOSE 123* 101*  BUN 25* 27*  CREATININE 0.84 0.92  CALCIUM 8.3* 8.5*   PT/INR No results for input(s): LABPROT, INR in the last 72 hours.  Recent Labs  Lab 03/29/19 0422 03/31/19 0502 04/04/19 0409  AST 33 25 22  ALT 33 33 29  ALKPHOS 198* 216* 158*  BILITOT 0.8 0.8 0.8  PROT 6.1* 6.7 6.8  ALBUMIN 2.1* 2.3* 2.4*     Lipase     Component Value Date/Time   LIPASE 24 03/18/2019 1601     Medications: . enoxaparin (LOVENOX) injection  40  mg Subcutaneous Q24H  . feeding supplement  1 Container Oral TID BM  . feeding supplement (PRO-STAT SUGAR FREE 64)  30 mL Oral TID BM  . lip balm  1 application Topical BID  . metoprolol tartrate  2.5 mg Intravenous Q8H  . polyethylene glycol  17 g Oral BID  . saccharomyces boulardii  250 mg Oral BID  . sodium chloride flush  10-40 mL Intracatheter Q12H   . dextrose 5 % and 0.45 % NaCl with KCl 20 mEq/L 10 mL/hr at 04/03/19 0600  . methocarbamol (ROBAXIN) IV    . ondansetron (ZOFRAN) IV     Anti-infectives (From admission, onward)   Start     Dose/Rate Route Frequency Ordered Stop   03/31/19 1400  piperacillin-tazobactam (ZOSYN) IVPB 3.375 g     3.375 g 12.5 mL/hr over 240 Minutes Intravenous Every 8 hours 03/31/19 0817 04/01/19 2359   03/29/19 1400  piperacillin-tazobactam (ZOSYN) IVPB 3.375 g  Status:  Discontinued     3.375 g 12.5 mL/hr over 240 Minutes Intravenous Every 8 hours 03/29/19 1238 03/31/19 0817   03/24/19 1200  piperacillin-tazobactam (ZOSYN) IVPB 3.375 g     3.375 g 12.5 mL/hr over 240 Minutes Intravenous Every 8 hours 03/24/19 1120 03/29/19 1007   03/19/19 0200  piperacillin-tazobactam (ZOSYN) IVPB 3.375 g  Status:  Discontinued  3.375 g 12.5 mL/hr over 240 Minutes Intravenous Every 8 hours 03/19/19 0028 03/19/19 0036   03/19/19 0200  piperacillin-tazobactam (ZOSYN) IVPB 3.375 g     3.375 g 12.5 mL/hr over 240 Minutes Intravenous Every 8 hours 03/19/19 0036 03/24/19 0659   03/18/19 2038  clindamycin (CLEOCIN) 900 mg, gentamicin (GARAMYCIN) 240 mg in sodium chloride 0.9 % 1,000 mL for intraperitoneal lavage  Status:  Discontinued       As needed 03/18/19 2038 03/18/19 2253   03/18/19 1845  ceFAZolin (ANCEF) IVPB 2g/100 mL premix     2 g 200 mL/hr over 30 Minutes Intravenous On call to O.R. 03/18/19 1747 03/18/19 1953   03/18/19 1845  metroNIDAZOLE (FLAGYL) IVPB 500 mg     500 mg 100 mL/hr over 60 Minutes Intravenous On call to O.R. 03/18/19 1747 03/18/19 1953    03/18/19 1830  clindamycin (CLEOCIN) 900 mg, gentamicin (GARAMYCIN) 240 mg in sodium chloride 0.9 % 1,000 mL for intraperitoneal lavage  Status:  Discontinued    Note to Pharmacy:  Have in the  OR room for final irrigation in bowel surgery case to minimize risk of abscess/infection Pharmacy may adjust dosing strength, schedule, rate of infusion, etc as needed to optimize therapy    Irrigation To Surgery 03/18/19 1828 03/19/19 0002   03/18/19 1806  metroNIDAZOLE (FLAGYL) 5-0.79 MG/ML-% IVPB    Note to Pharmacy:  Marney DoctorMentley, Pamela   : cabinet override      03/18/19 1806 03/18/19 1923   03/18/19 1806  ceFAZolin (ANCEF) 2-4 GM/100ML-% IVPB    Note to Pharmacy:  Marney DoctorMentley, Pamela   : cabinet override      03/18/19 1806 03/18/19 1923     Prior to Admission medications   Not on File     Assessment/Plan Recurrent left inguinal hernia, incarcerated with gangrenous perforated strangulated jejunum causing a small bowel obstruction, right indirect inguinal hernia, right femoral hernia Laparoscopic lysis of adhesions, small bowel resection, laparoscopic BIH repairs with absorbable Phasix mesh, Tappblock, 03/18/2019, Dr. Karie SodaSteven Gross - POD 17 -mobilize and pulm toilet.  -cont JP drain for now and follow, will remove prior to d/c -CTscan4/16reviewed withIR, nothing really to drain -repeatCT scan4/22 with several persistent small fluid collections stable in size or smaller than on previous scan, no drainable fluid collection, no evidence of bowel obstruction  ARF- resolved,  Tachycardia -resolved tachypnea/dyspnea-echo and duplex 4/6514fine, improving, medicine consulted and s/o AMS -resolved Hypokalemia-3.9today, resolved ModerateMalnutrition - 7.4>>11.7(4/20) FEN- Regular diet /  (TPN 4/17 - -04/03/19) VTE-Lovenox, SCDs ID-Zosyn 4/11>>4/24. WBC normalized, afebrile Foley - none Follow up - Dr. Leanne ChangGross Contact - daughterMaria [phone]  Plan: Patient thinks he is  ready to go home.  Talk to his daughter Harrel CarinaMaria McLean.  She is going to talk with her father, will discuss discharge either today or tomorrow.  I am going to discontinue the beta-blocker this AM also.    LOS: 17 days    Rubina Basinski 04/04/2019 [phone]

## 2019-04-04 NOTE — Progress Notes (Addendum)
Nutrition Follow-up  INTERVENTION:   -D/c Calorie Count -Continue Prostat liquid protein PO 30 ml TID with meals, each supplement provides 100 kcal, 15 grams protein. -Continue Boost Breeze po TID, each supplement provides 250 kcal and 9 grams of protein  NUTRITION DIAGNOSIS:   Inadequate oral intake related to inability to eat as evidenced by NPO status.  Now on regular diet.  GOAL:   Patient will meet greater than or equal to 90% of their needs  Progressing.  MONITOR:   PO intake, Supplement acceptance, Labs, Weight trends, Other (Comment)(TPN regimen)  REASON FOR ASSESSMENT:   Consult Assessment of nutrition requirement/status, Calorie Count  ASSESSMENT:   68 year old male who has not sought medical treatment in some time. He reported L inguinal hernia repair in 1991 with recurring bulging ~10 years ago. Patient reported it is usually reducible not particularly bothersome; he can usually massage it back in. Patient was admitted on 4/10 d/t worsening abdominal pain x2 days with constipation and no BM during that time. Patient had also reported progressive N/V.  4/10: admitted for incarcerated left inguinal hernia perforation of the jejunum causing SBO. NPO. S/p LOA, SB resection, lap bilateral inguinal and right femoral hernia repairs   4/17: PICC placed and TPN initiated, 4/21: Diet advanced from NPO to CLD. TPN at goal. 4/24: Diet advanced to soft. Calorie Count ordered. 4/26: TPN d/c  **RD working remotely**  Patient now on a regular diet. Per RN, Calorie count was not documented over the weekend. Pt ordered a tray of yogurts x 3 and grape juice x 3 for breakfast this morning. RN to report back what pt consumed. **RN reports pt consumed 10% of eggs and oatmeal (providing ~26 kcal, 1g protein).  Pt has been consuming 1 Boost Breeze daily (250 kcal, 9g protein) and consumed 3 Prostats yesterday (300 kcal, 45g protein). As for meals, pt has been receiving house trays and  the only thing he has been ordering himself is grape juice and yogurt.  No new weights have been recorded since 4/24.  Noted that discharge order has been placed for today.  Medications: Florastor capsule BID Labs reviewed: CBGs: 97  Diet Order:   Diet Order            Diet regular Room service appropriate? Yes; Fluid consistency: Thin  Diet effective now              EDUCATION NEEDS:   Not appropriate for education at this time  Skin:  Skin Assessment: Skin Integrity Issues: Skin Integrity Issues:: Incisions Incisions: abdomen (4/10)  Last BM:  4/26  Height:   Ht Readings from Last 1 Encounters:  03/18/19 5\' 9"  (1.753 m)    Weight:   Wt Readings from Last 1 Encounters:  04/01/19 67.3 kg    Ideal Body Weight:  72.73 kg  BMI:  Body mass index is 21.91 kg/m.  Estimated Nutritional Needs:   Kcal:  2707-8675 kcal  Protein:  100-115 grams  Fluid:  >/= 2 L/day  Tilda Franco, MS, RD, LDN [facility] Inpatient Clinical Dietitian Pager: [phone] After Hours Pager: [phone]

## 2019-04-04 NOTE — Progress Notes (Signed)
Physical Therapy Treatment Patient Details Name: Lucienne Capersnthony Fahrner MRN: [id number] DOB: 10/09/1951 Today's Date: 04/04/2019    History of Present Illness Pt is a 68 year old male s/p laparoscopic BIH repairs with absorbable Phasix mesh with SBR for recurrent incarcerated LIH with gangrenous perforation of jejunum and SBO as well as right femoral hernia on 03/18/19    PT Comments    The patient ambulated x 4800 feet with 4 wheeled Rw, HR 116, saturation on RA 99. Provided patient with name of restorator  That can be ordered for U and LE exercises. Patient to go to a friends home and feels there is ample room to ambulate in the home. Patient hopeful to DC soon. PT will sign off as patient can ambulate with nursing and is ambulating a long distance with RW and no physical assistance.  Follow Up Recommendations  Home health PT;Supervision for mobility/OOB     Equipment Recommendations  (has 4 wheeler delivered)    Recommendations for Other Services       Precautions / Restrictions Precautions Precautions: Fall Precaution Comments: JP Drain Restrictions Weight Bearing Restrictions: No    Mobility  Bed Mobility               General bed mobility comments: OOB in rocker  Transfers Overall transfer level: Modified independent Equipment used: 4-wheeled walker Transfers: Sit to/from Stand              Ambulation/Gait Ambulation/Gait assistance: Chief Operating Officerupervision Gait Distance (Feet): 4800 Feet(12 laps) Assistive device: 4-wheeled walker   Gait velocity: WNL   General Gait Details: required NO seated rest breaks at a Mod rate of speed   Stairs             Wheelchair Mobility    Modified Rankin (Stroke Patients Only)       Balance Overall balance assessment: Modified Independent                                          Cognition Arousal/Alertness: Awake/alert                                            Exercises      General Comments        Pertinent Vitals/Pain Pain Assessment: No/denies pain    Home Living                      Prior Function            PT Goals (current goals can now be found in the care plan section) Progress towards PT goals: Progressing toward goals    Frequency    Min 3X/week      PT Plan Current plan remains appropriate    Co-evaluation              AM-PAC PT "6 Clicks" Mobility   Outcome Measure  Help needed turning from your back to your side while in a flat bed without using bedrails?: None Help needed moving from lying on your back to sitting on the side of a flat bed without using bedrails?: None Help needed moving to and from a bed to a chair (including a wheelchair)?: None Help needed standing up from a chair using your arms (e.g., wheelchair  or bedside chair)?: None Help needed to walk in hospital room?: None Help needed climbing 3-5 steps with a railing? : A Little 6 Click Score: 23    End of Session   Activity Tolerance: Patient tolerated treatment well Patient left: in chair;with call bell/phone within reach Nurse Communication: Mobility status PT Visit Diagnosis: Other abnormalities of gait and mobility (R26.89)     Time: 2902-1115 PT Time Calculation (min) (ACUTE ONLY): 25 min  Charges:  $Gait Training: 23-37 mins                     Blanchard Kelch PT Acute Rehabilitation Services Pager [phone] Office [phone]    Rada Hay 04/04/2019, 10:42 AM

## 2019-04-04 NOTE — TOC Transition Note (Signed)
Transition of Care [facility]) - CM/SW Discharge Note   Patient Details  Name: Omar Bell MRN: [id number] Date of Birth: 1951-02-02  [facility] of Care [facility] CM/SW Contact:  Clearance Coots, LCSW Phone Number: 04/04/2019, 1:45 PM   Clinical Narrative:    [facility] PT/ RN follow up with patient. Patient daughter aware.    Final next level of care: Skilled Nursing Facility     Patient Goals and CMS Choice Patient states their goals for this hospitalization and ongoing recovery are:: "return home" CMS Medicare.gov Compare Post Acute Care list provided to:: Patient Choice offered to / list presented to : Patient  Discharge Placement                Patient to be transferred to facility by: PTAR  Name of family member notified: Daughter Byrd Hesselbach  Patient and family notified of of transfer: 04/04/19  Discharge Plan and Services In-house Referral: Clinical Social Work Discharge Planning Services: CM Consult Post Acute Care Choice: Home Health          DME Arranged: Walker rolling with seat DME Agency: [employer] Date DME Agency Contacted: 03/31/19 Time DME Agency Contacted: 1135 Representative spoke with at DME Agency: [place] HH Arranged: PT, RN HH Agency: [facility] Date HH Agency Contacted: 03/31/19 Time HH Agency Contacted: 1418 Representative spoke with at [facility] Agency: Clydie Braun   Social Determinants of Health (SDOH) Interventions     Readmission Risk Interventions No flowsheet data found.

## 2019-04-04 NOTE — Progress Notes (Signed)
   04/04/19 1347 04/04/19 1351 04/04/19 1355  Vitals  Temp 98 F (36.7 C) 98 F (36.7 C) 98.1 F (36.7 C)  Temp Source Oral Oral Oral  BP 113/72 102/68 112/70  MAP (mmHg) 84 80  --   BP Location  --  Left Arm Left Arm  BP Method Automatic Automatic Automatic  Patient Position (if appropriate) Standing Lying Sitting  Pulse Rate (!) 111 88 90  Pulse Rate Source Monitor Monitor Monitor    2nd set of orthostatic vitals   Will update PA and continue to monitor

## 2019-04-04 NOTE — Progress Notes (Signed)
   04/04/19 1150 04/04/19 1152 04/04/19 1200  Vitals  Temp 97.7 F (36.5 C) 97.7 F (36.5 C) 97.7 F (36.5 C)  Temp Source Oral Oral Oral  BP 113/69 (Lying) 101/64 (Sitting ) 103/68 (Standing)  MAP (mmHg) 84 75 77  BP Location Right Arm Right Arm Right Arm  BP Method Automatic Automatic Automatic  Patient Position (if appropriate) Lying Sitting Standing  Pulse Rate 100 (!) 101 90  Pulse Rate Source Monitor Monitor Monitor   First set of orthostatic vitals  Will continue to monitor and update PA

## 2019-04-04 NOTE — Evaluation (Signed)
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 Pt currently with functional limitations due to the deficits listed below (see OT Problem List).  Pt will benefit from skilled OT to increase their safety and independence with ADL and functional mobility for ADL to facilitate discharge to venue listed below.      Follow Up Recommendations  No OT follow up    Equipment Recommendations  3 in 1 bedside commode    Recommendations for Other Services       Precautions / Restrictions Precautions Precautions: Fall Precaution Comments: JP Drain      Mobility Bed Mobility               General bed mobility comments: OOB in rocker  Transfers Overall transfer level: Needs assistance Equipment used: 4-wheeled walker Transfers: Sit to/from [employer] Sit to Stand: Supervision Stand pivot transfers: Supervision            Balance Overall balance assessment: Modified Independent                                         ADL either performed or assessed with clinical judgement   ADL Overall ADL's : Needs assistance/impaired Eating/Feeding: Set up;Sitting   Grooming: Set up;Standing   Upper Body Bathing: Set up;Sitting   Lower Body Bathing: Sit to/from stand;Minimal assistance   Upper Body Dressing : Set up;Sitting   Lower Body Dressing: Minimal assistance;Sit to/from stand;Cueing for sequencing;Cueing for safety   Toilet Transfer: Min guard;RW;Ambulation;Stand-pivot   Toileting- Clothing Manipulation and Hygiene: Supervision/safety;Sit to/from stand;Cueing for  sequencing;Cueing for safety   Tub/ Shower Transfer: Tub transfer;Min Psychologist, prison and probation services Details (indicate cue type and reason): simulated - pt will have daughter A Functional mobility during ADLs: Min guard;Cueing for safety;Cueing for sequencing       Vision Patient Visual Report: No change from baseline              Pertinent Vitals/Pain Pain Assessment: No/denies pain     Hand Dominance     Extremity/Trunk Assessment Upper Extremity Assessment Upper Extremity Assessment: Generalized weakness           Communication Communication Communication: No difficulties   Cognition Arousal/Alertness: Awake/alert Behavior During Therapy: WFL for tasks assessed/performed Overall Cognitive Status: Within Functional Limits for tasks assessed                                                Home Living Family/patient expects to be discharged to:: Private residence Living Arrangements: Alone Available Help at Discharge: Neighbor Type of Home: House       Home Layout: Two level;Bed/bath upstairs Alternate Level Stairs-Number of Steps: flight             Home Equipment: None   Additional Comments: pt reports he lives alone in an apt however was staying with an elderly person during COVID-19 stay at home order  Prior Functioning/Environment Level of Independence: Independent        Comments: reports performing a lot of yardwork prior to admission        OT Problem List: Decreased strength;Decreased activity tolerance         OT Goals(Current goals can be found in the care plan section) Acute Rehab OT Goals Patient Stated Goal: home with daughter to A OT Goal Formulation: With patient Time For Goal Achievement: 04/18/19  OT Frequency:      AM-PAC OT "6 Clicks" Daily Activity     Outcome Measure Help from another person eating meals?: None Help from another person taking care of personal grooming?: None Help from another  person toileting, which includes using toliet, bedpan, or urinal?: A Little Help from another person bathing (including washing, rinsing, drying)?: A Little Help from another person to put on and taking off regular upper body clothing?: None Help from another person to put on and taking off regular lower body clothing?: A Little 6 Click Score: 21   End of Session Equipment Utilized During Treatment: Rolling walker Nurse Communication: Mobility status  Activity Tolerance: Patient tolerated treatment well Patient left: in chair;with call bell/phone within reach  OT Visit Diagnosis: Unsteadiness on feet (R26.81);Other abnormalities of gait and mobility (R26.89)                Time: 1450-1504 OT Time Calculation (min): 14 min Charges:  OT General Charges $OT Visit: 1 Visit OT Evaluation $OT Eval Moderate Complexity: 1 Mod  Lise AuerLori Nikkolas Coomes, OT Acute Rehabilitation Services [phone]- [phone] Office- [phone]     Makaela Cando, Karin GoldenLorraine D 04/04/2019, 3:59 PM

## 2020-09-17 IMAGING — DX ABDOMEN - 1 VIEW
1 series · 1 of 1 positions shown · non-contrast
Comparison: 03/18/2019

CLINICAL DATA: Check gastric catheter placement

EXAM:
ABDOMEN - 1 VIEW

[abdomen kub]
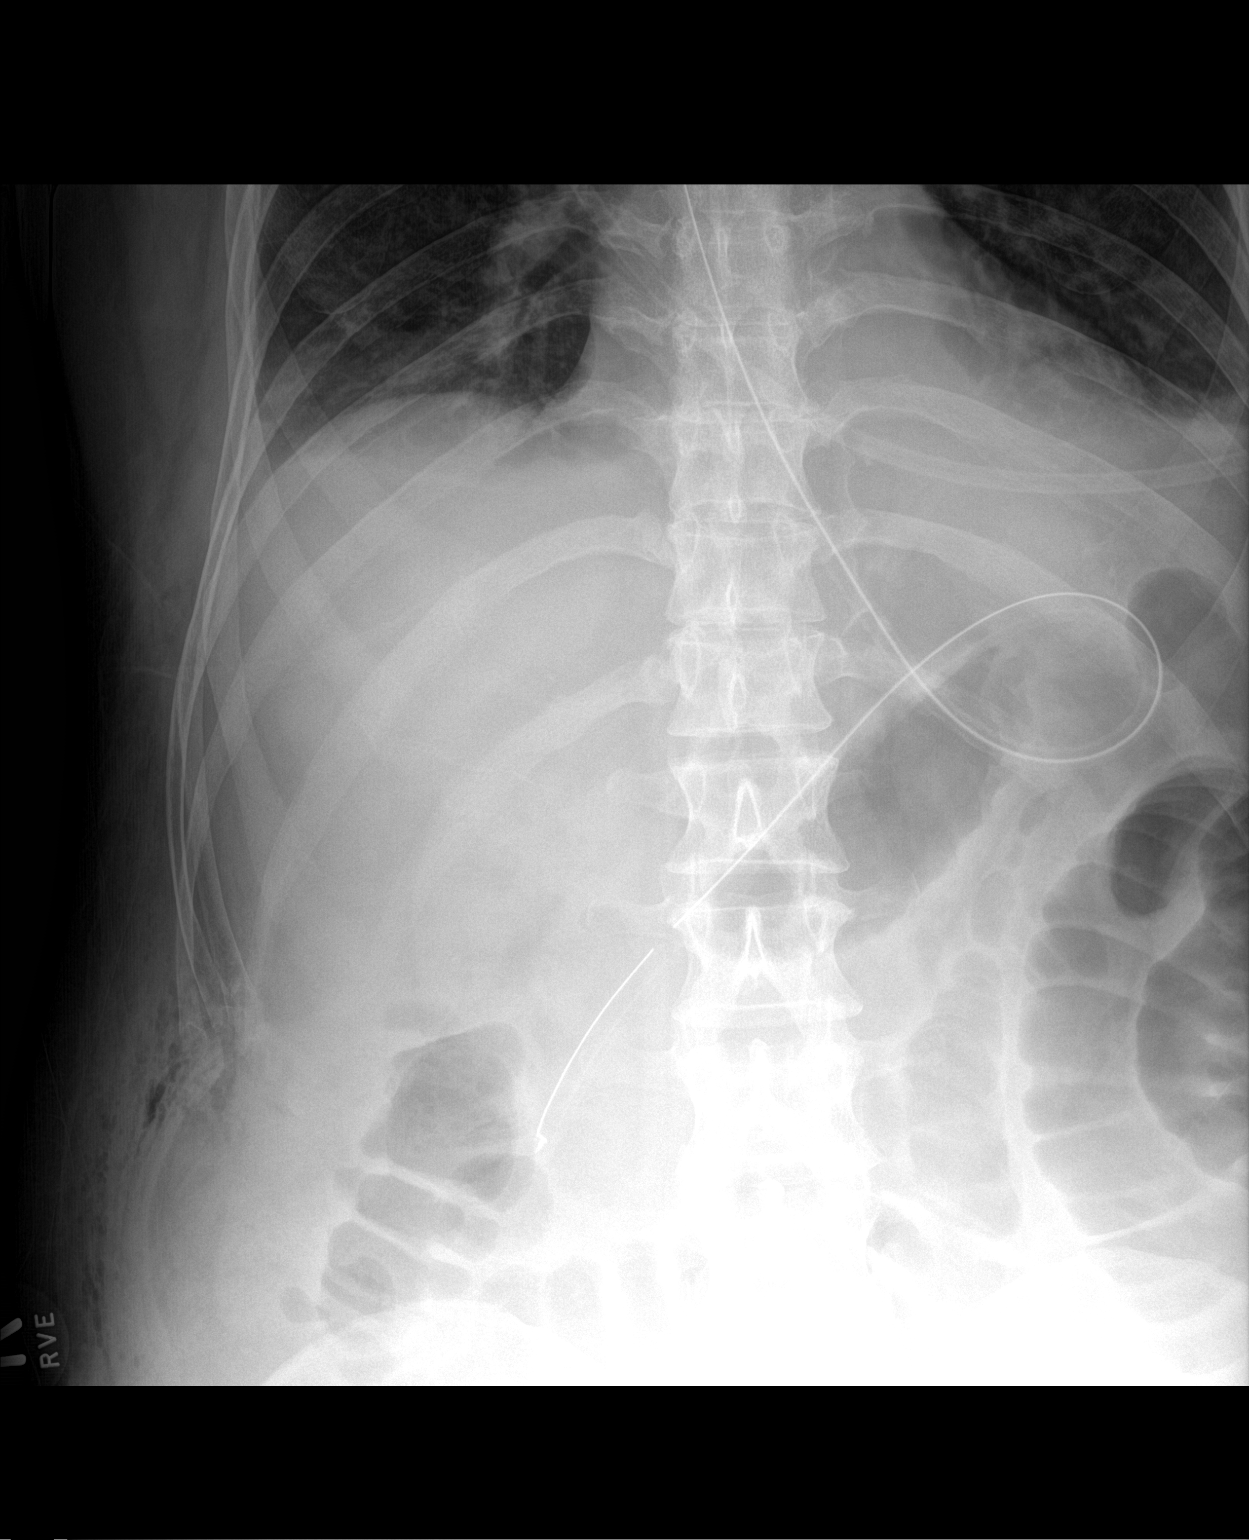

[1 of 1 positions shown; findings below may reference images not displayed]

FINDINGS: Scattered large and small bowel gas is noted. Gastric catheter is
noted within the stomach extending into the proximal duodenum.
Persistent small bowel dilatation is noted on the left similar to
that seen on the prior exam.
IMPRESSION: Gastric catheter within the stomach extending into the proximal
duodenum.

## 2020-09-21 IMAGING — CT CT ABDOMEN AND PELVIS WITH CONTRAST
2 of 5 series · 15 of 46 positions shown, 17 images · IV contrast (OMNIPAQUE)
Comparison: None.

CLINICAL DATA: Status post incarcerated left inguinal hernia
repair, jejunal perforation, small-bowel obstruction

EXAM:
CT ABDOMEN AND PELVIS WITH CONTRAST
TECHNIQUE: Multidetector CT imaging of the abdomen and pelvis was performed
using the standard protocol following bolus administration of
intravenous contrast.
CONTRAST:  100mL OMNIPAQUE IOHEXOL 300 MG/ML  SOLN

[Series 2: axial st · axial · 0.85mm/px · z∈[-441,-21]mm · 12 of 98 slices shown, 14 images]
[im 7/98  soft-tissue]
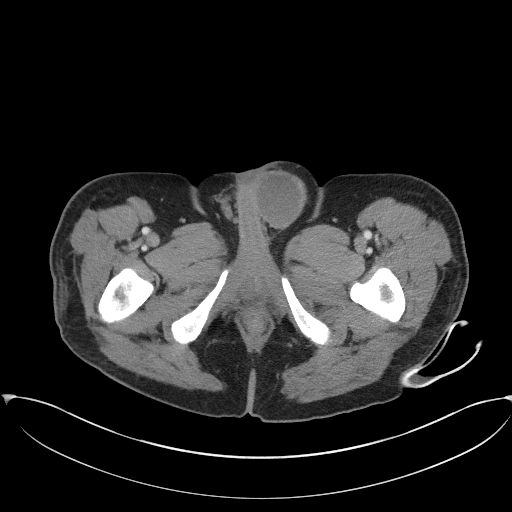
[im 7/98  bone]
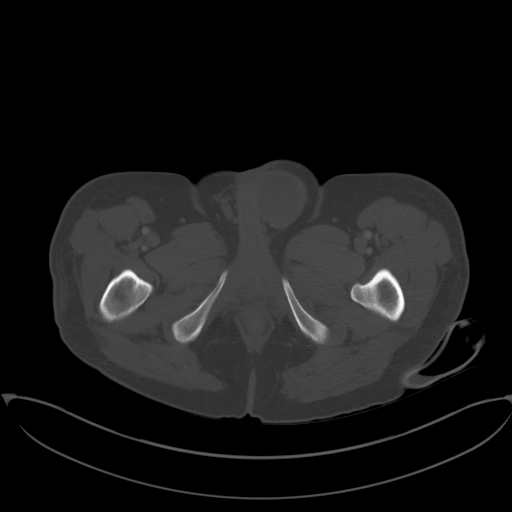
[im 14/98  soft-tissue]
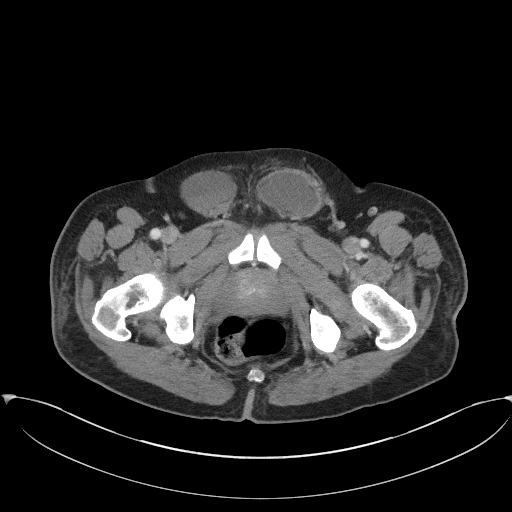
[im 21/98  soft-tissue]
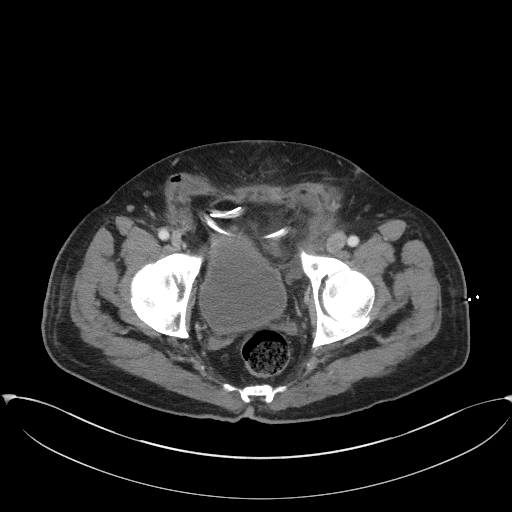
[im 28/98  soft-tissue]
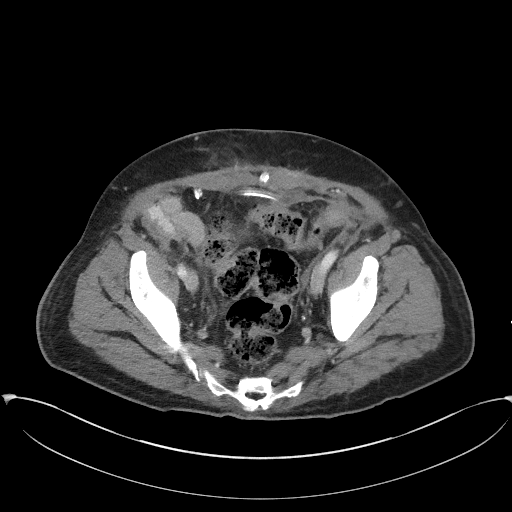
[im 35/98  soft-tissue]
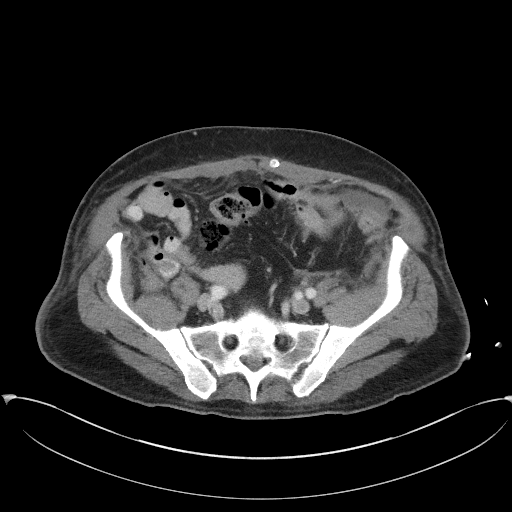
[im 42/98  soft-tissue]
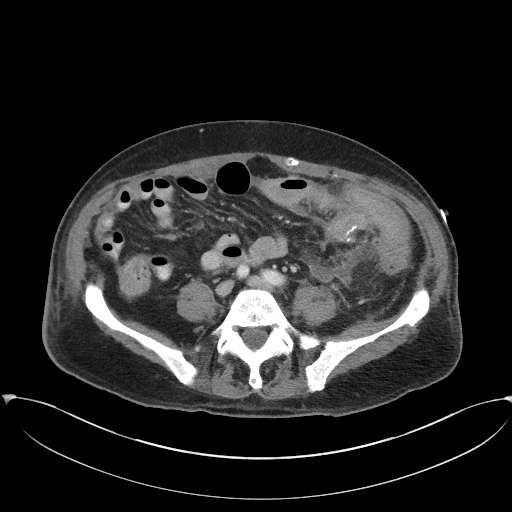
[im 56/98  soft-tissue]
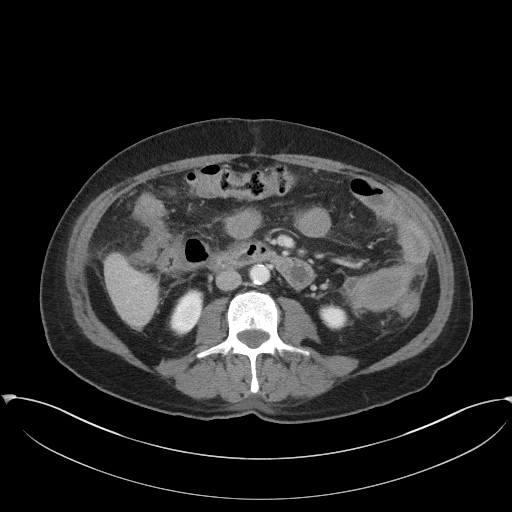
[im 63/98  soft-tissue]
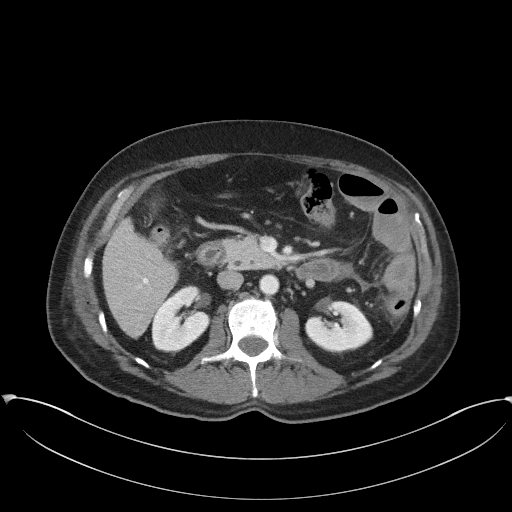
[im 70/98  soft-tissue]
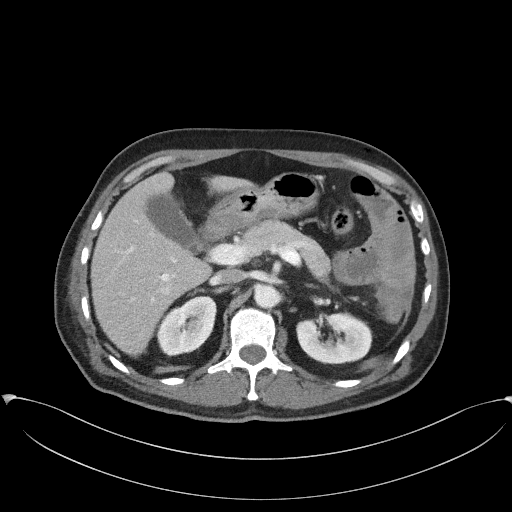
[im 70/98  bone]
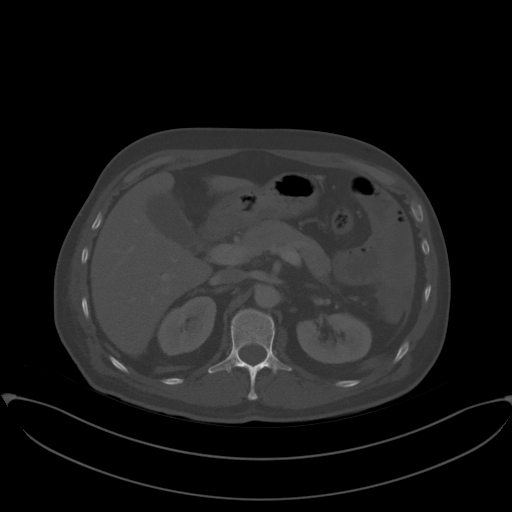
[im 77/98  soft-tissue]
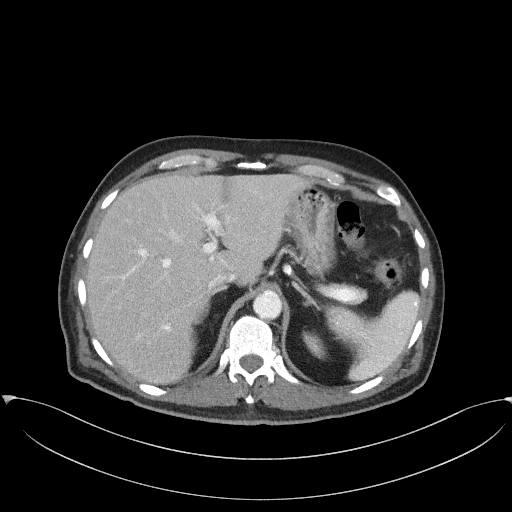
[im 84/98  soft-tissue]
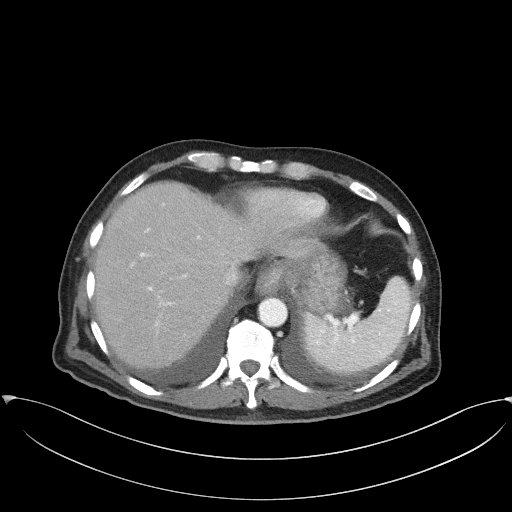
[im 91/98  soft-tissue]
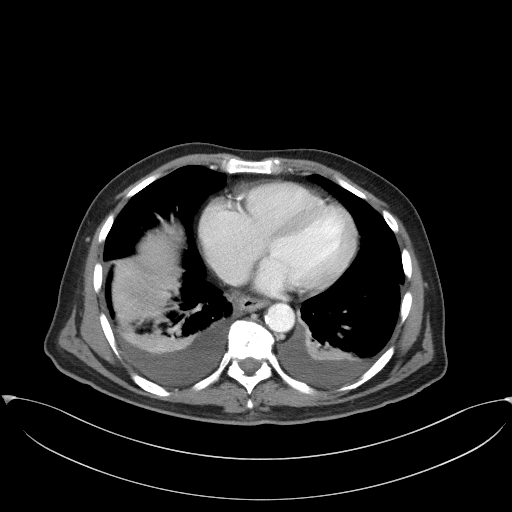

[Series 4: coronal st · coronal · 0.84mm/px · 3 of 81 slices shown]
[im 27/81  soft-tissue]
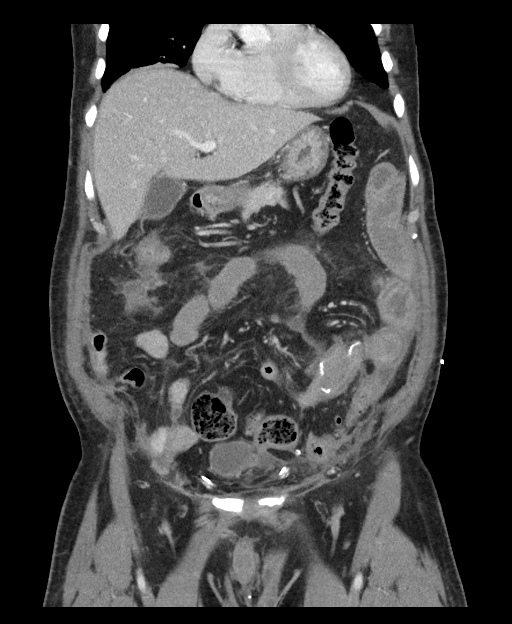
[im 36/81  soft-tissue]
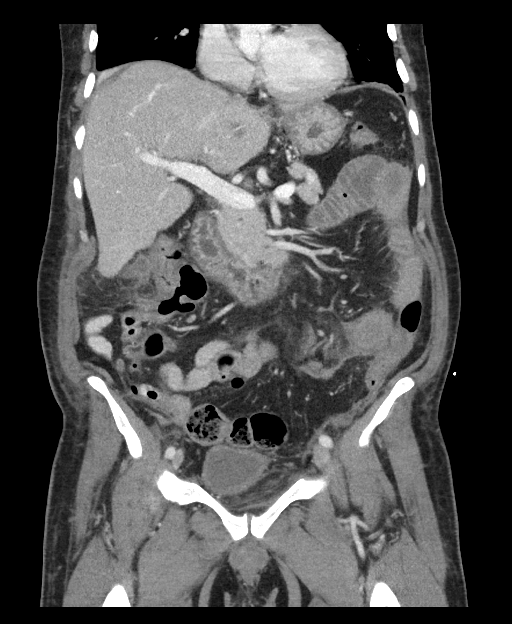
[im 45/81  soft-tissue]
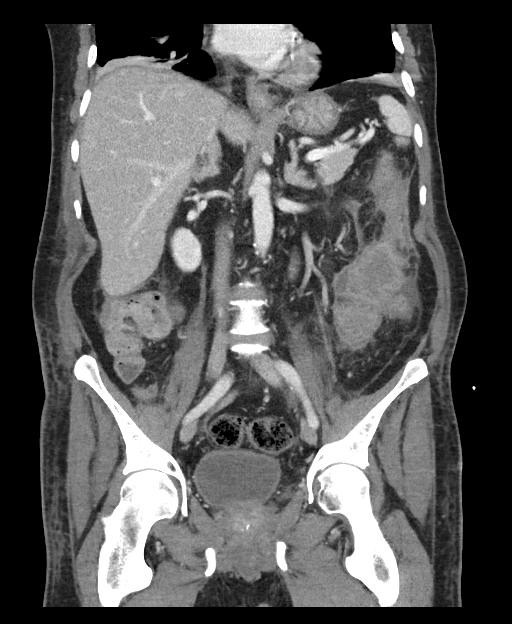

[15 of 46 positions shown; findings below may reference images not displayed]

FINDINGS: Lower chest: Small to moderate bilateral pleural effusions
associated atelectasis or consolidation. Three-vessel coronary
artery calcifications.

Hepatobiliary: No focal liver abnormality is seen. No gallstones,
gallbladder wall thickening, or biliary dilatation.

Pancreas: Unremarkable. No pancreatic ductal dilatation or
surrounding inflammatory changes.

Spleen: Normal in size without focal abnormality.

Adrenals/Urinary Tract: Adrenal glands are unremarkable. Kidneys are
normal, without renal calculi, focal lesion, or hydronephrosis.
Bladder is unremarkable.

Stomach/Bowel: Stomach is within normal limits. Appendix appears
normal. Status post proximal small bowel resection in the left
hemiabdomen. There is extensive inflammatory thickening involving
the proximal and mid small bowel in the left hemiabdomen as well as
the cecum. There are multiple small collections of fluid about the
bowel loops and mesentery in the left hemiabdomen (series 2, image
48, 54, 61). The most suspicious fluid collection contains small
locules of air and is located posteriorly in the left hemiabdomen,
measuring approximately 3.6 cm, and may be a developing abscess
(series 2, image 54).

Vascular/Lymphatic: Mixed calcific atherosclerosis. No enlarged
abdominal or pelvic lymph nodes.

Reproductive: No mass or other abnormality.

Other: Fluid in the inguinal canals bilaterally. Tunneled surgical
drain is looped about the central low abdomen. No abdominopelvic
ascites.

Musculoskeletal: No acute or significant osseous findings.
IMPRESSION: 1. Status post proximal small bowel resection in the left
hemiabdomen. There is extensive inflammatory thickening involving
the proximal and mid small bowel in the left hemiabdomen as well as
the cecum, nonspecific in the recent postoperative setting.

2. There are multiple small collections of fluid about the bowel
loops and mesentery in the left hemiabdomen (series 2, image 48, 54,
61), nonspecific in the setting of recent surgery. The most
suspicious fluid collection contains small locules of air and is
located posteriorly in the left hemiabdomen, measuring approximately
3.6 cm, and may be a developing abscess (series 2, image 54).

3.  Other findings as above.

## 2020-09-27 IMAGING — CT CT ABDOMEN AND PELVIS WITH CONTRAST
2 of 5 series · 15 of 46 positions shown, 17 images · IV contrast (OMNIPAQUE)
Comparison: CT scan of March 24, 2019.

CLINICAL DATA: Abdominal infection.

EXAM:
CT ABDOMEN AND PELVIS WITH CONTRAST
TECHNIQUE: Multidetector CT imaging of the abdomen and pelvis was performed
using the standard protocol following bolus administration of
intravenous contrast.
CONTRAST:  100mL OMNIPAQUE IOHEXOL 300 MG/ML SOLN, 30mL OMNIPAQUE
IOHEXOL 300 MG/ML SOLN

[Series 2: axial st · axial · 0.95mm/px · z∈[+1166,+1606]mm · 12 of 104 slices shown, 14 images]
[im 8/104  soft-tissue]
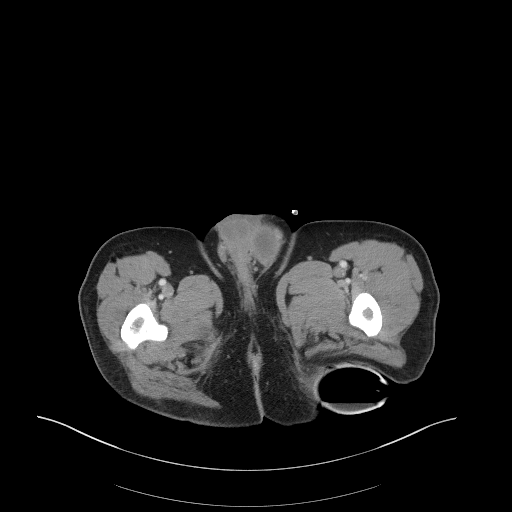
[im 8/104  bone]
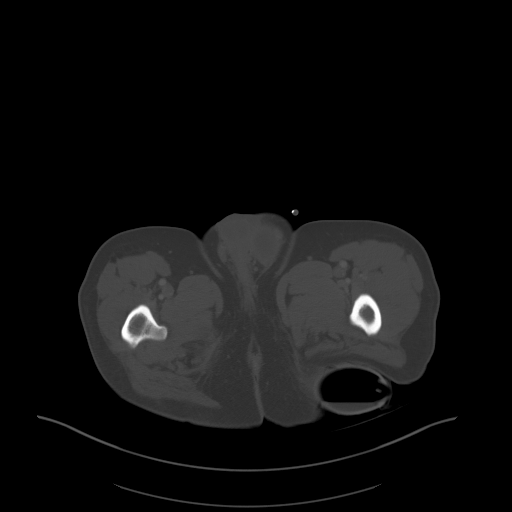
[im 15/104  soft-tissue]
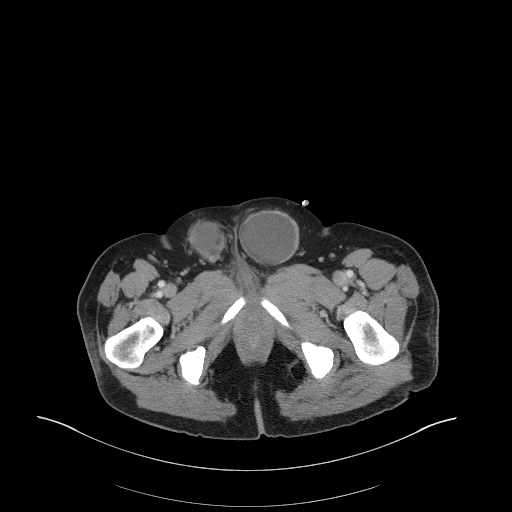
[im 23/104  soft-tissue]
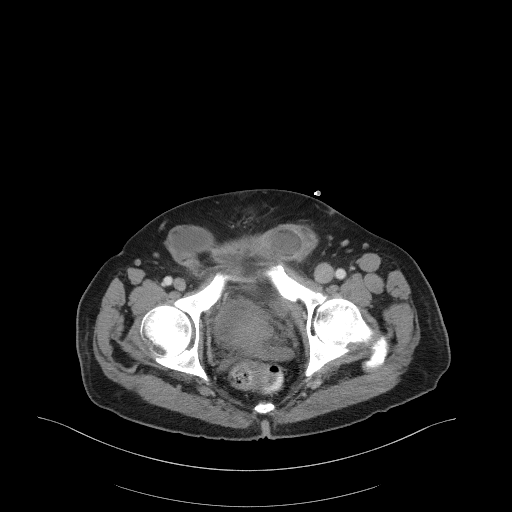
[im 30/104  soft-tissue]
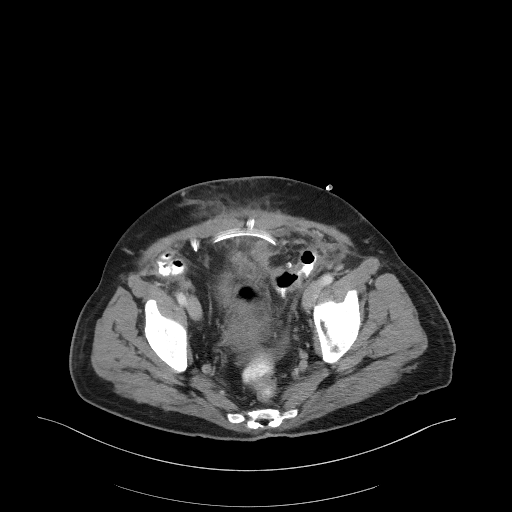
[im 37/104  soft-tissue]
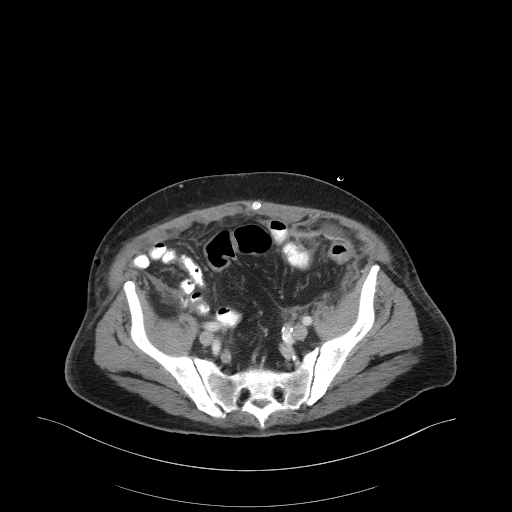
[im 45/104  soft-tissue]
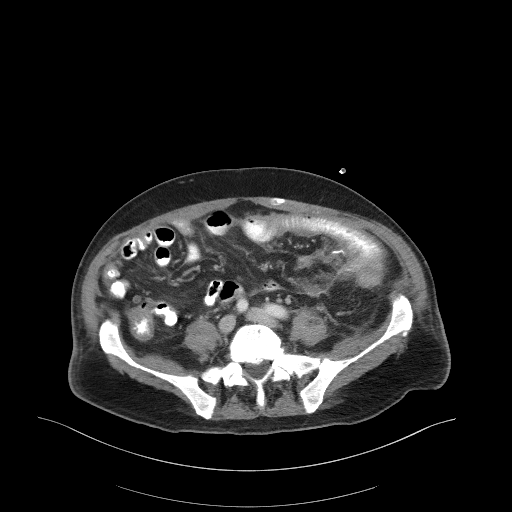
[im 59/104  soft-tissue]
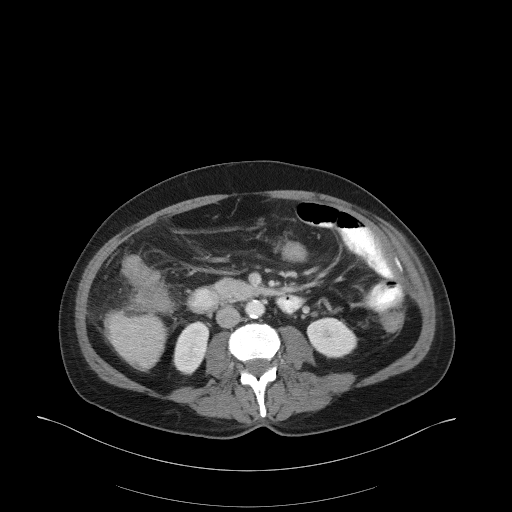
[im 67/104  soft-tissue]
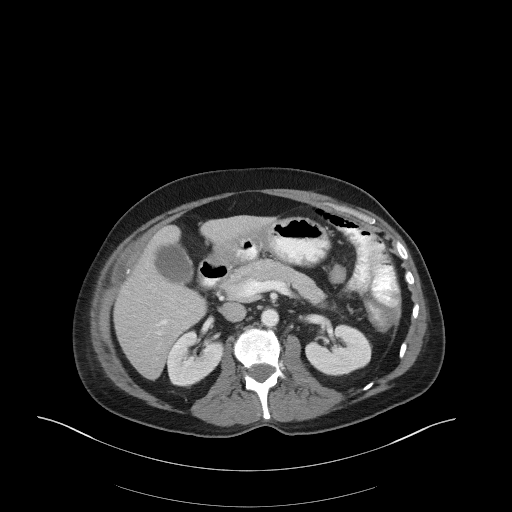
[im 74/104  soft-tissue]
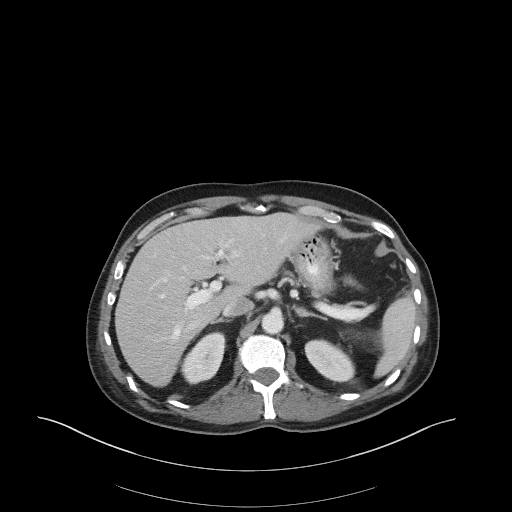
[im 74/104  bone]
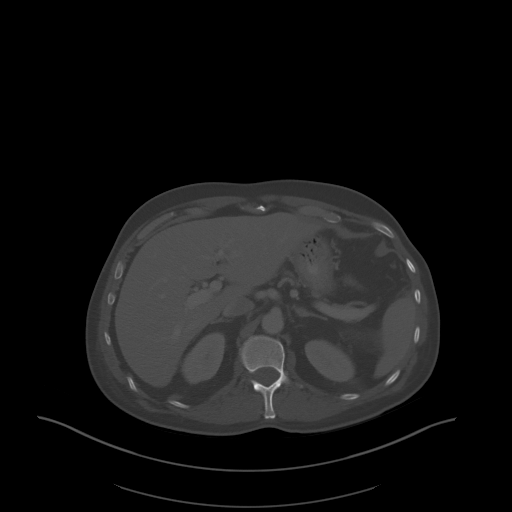
[im 81/104  soft-tissue]
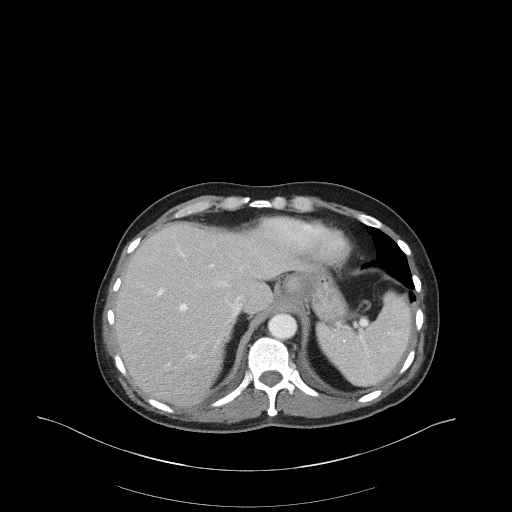
[im 89/104  soft-tissue]
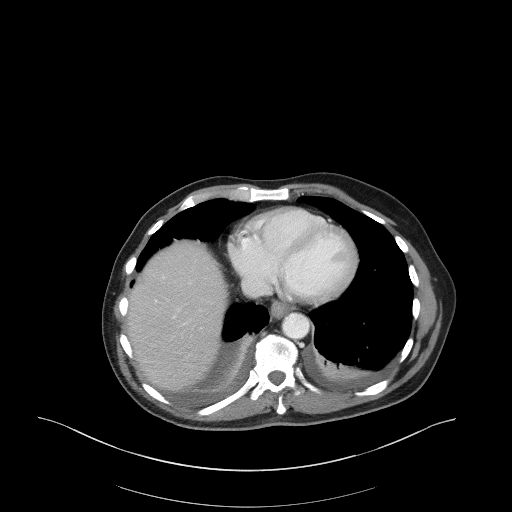
[im 96/104  soft-tissue]
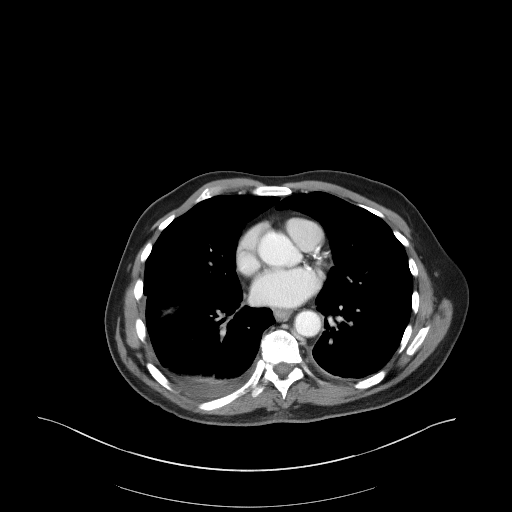

[Series 4: coronal st · coronal · 0.83mm/px · 3 of 80 slices shown]
[im 27/80  soft-tissue]
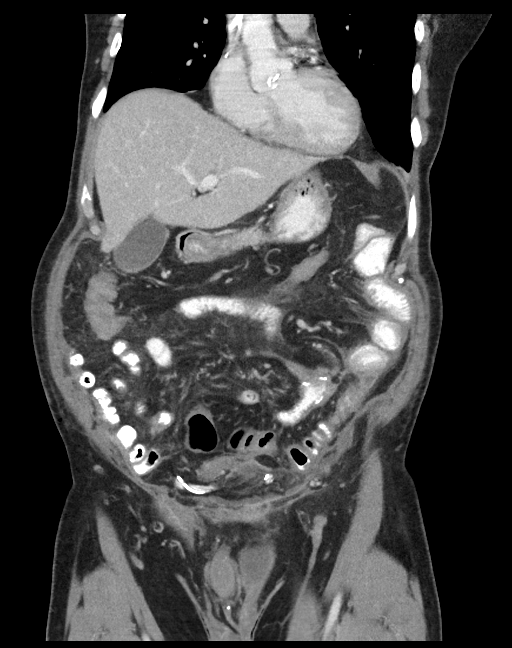
[im 36/80  soft-tissue]
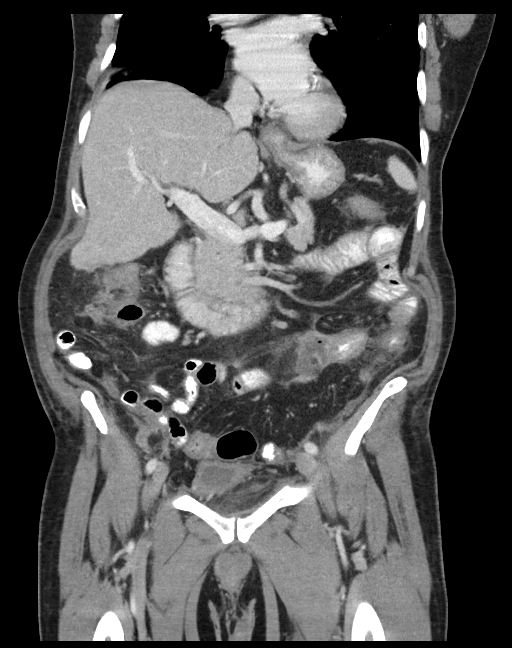
[im 44/80  soft-tissue]
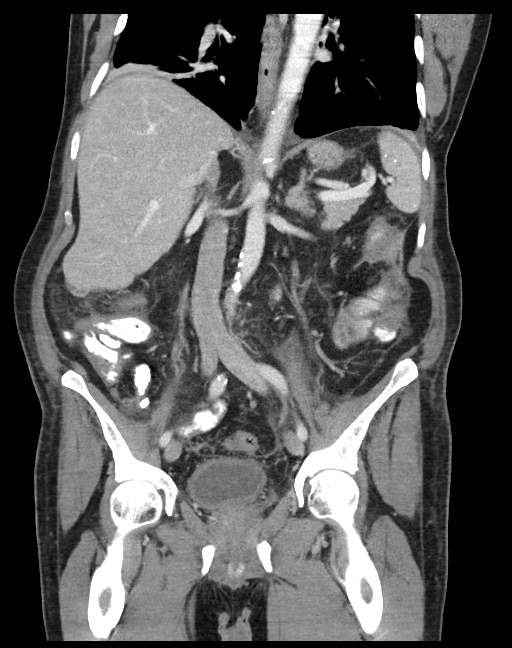

[15 of 46 positions shown; findings below may reference images not displayed]

FINDINGS: Lower chest: Mild bilateral pleural effusions are noted with
adjacent subsegmental atelectasis.

Hepatobiliary: No focal liver abnormality is seen. No gallstones,
gallbladder wall thickening, or biliary dilatation.

Pancreas: Unremarkable. No pancreatic ductal dilatation or
surrounding inflammatory changes.

Spleen: Stable calcified splenic granulomata are noted.

Adrenals/Urinary Tract: Adrenal glands are unremarkable. Kidneys are
normal, without renal calculi, focal lesion, or hydronephrosis.
Bladder is unremarkable.

Stomach/Bowel: The stomach is unremarkable. The appendix appears
normal. No definite evidence of bowel obstruction is noted.
Postsurgical changes are seen involving the small bowel in the left
lower quadrant of the abdomen. Mild wall and fold thickening is seen
involving the small bowel and the left lower quadrant consistent
with focal inflammation.

Vascular/Lymphatic: Aortic atherosclerosis. No enlarged abdominal or
pelvic lymph nodes.

Reproductive: There are again noted 2 moderate size inguinal
hernias, left greater than right, both of which are filled with
fluid. Stable position of surgical drain is seen anteriorly in the
pelvis.

Other: Small amount of free fluid is noted in the dependent portion
of the pelvis. 23 x 12 mm fluid collection is noted in the left
lower quadrant best seen on image number 57 of series 2 which is
significantly smaller compared to prior exam. Ill-defined fluid
collection measuring 24 x 19 mm is noted in the left side of the
abdomen best seen on image number 51 of series 2 which is not
significantly changed compared to prior exam. Grossly stable fluid
collection measuring 54 x 19 mm is noted anteriorly in the left
lower quadrant best seen on image number 66 of series 2.

Musculoskeletal: No acute or significant osseous findings.
IMPRESSION: Status post small bowel resection. Continued presence of
inflammatory wall and fold thickening is seen involving small bowel
loops in the pelvis. Several small fluid collections are noted in
the left side of the abdomen, most of which are stable in size
compared to prior exam, although at least 1 is significantly smaller
compared to prior exam.

Continued presence of 2 moderate size inguinal hernias, left greater
than right, both of which are filled with fluid.

Surgical drain is unchanged in position.

Mild bilateral pleural effusions are noted with adjacent
subsegmental atelectasis.

Aortic Atherosclerosis ([id number]).
